# Patient Record
Sex: Male | Born: 1970 | Race: Black or African American | Hispanic: No | Marital: Single | Smoking: Former smoker
Health system: Southern US, Community
[De-identification: ages and names within clinical notes are randomized; demographics above are authoritative.]

## PROBLEM LIST (undated history)

## (undated) DIAGNOSIS — G4730 Sleep apnea, unspecified: Secondary | ICD-10-CM

## (undated) DIAGNOSIS — I10 Essential (primary) hypertension: Secondary | ICD-10-CM

## (undated) DIAGNOSIS — E119 Type 2 diabetes mellitus without complications: Secondary | ICD-10-CM

## (undated) DIAGNOSIS — E1122 Type 2 diabetes mellitus with diabetic chronic kidney disease: Secondary | ICD-10-CM

## (undated) DIAGNOSIS — T7840XA Allergy, unspecified, initial encounter: Secondary | ICD-10-CM

## (undated) DIAGNOSIS — I340 Nonrheumatic mitral (valve) insufficiency: Secondary | ICD-10-CM

## (undated) DIAGNOSIS — E1100 Type 2 diabetes mellitus with hyperosmolarity without nonketotic hyperglycemic-hyperosmolar coma (NKHHC): Secondary | ICD-10-CM

## (undated) DIAGNOSIS — E785 Hyperlipidemia, unspecified: Secondary | ICD-10-CM

## (undated) DIAGNOSIS — I5189 Other ill-defined heart diseases: Secondary | ICD-10-CM

## (undated) DIAGNOSIS — N189 Chronic kidney disease, unspecified: Secondary | ICD-10-CM

## (undated) DIAGNOSIS — Z6841 Body Mass Index (BMI) 40.0 and over, adult: Secondary | ICD-10-CM

## (undated) HISTORY — DX: Type 2 diabetes mellitus without complications: E11.9

## (undated) HISTORY — DX: Allergy, unspecified, initial encounter: T78.40XA

## (undated) HISTORY — DX: Hyperlipidemia, unspecified: E78.5

## (undated) HISTORY — DX: Chronic kidney disease, unspecified: N18.9

## (undated) HISTORY — PX: TOOTH EXTRACTION: SUR596

## (undated) HISTORY — PX: WISDOM TOOTH EXTRACTION: SHX21

## (undated) HISTORY — PX: OTHER SURGICAL HISTORY: SHX169

---

## 2017-01-31 DIAGNOSIS — Z23 Encounter for immunization: Secondary | ICD-10-CM | POA: Diagnosis not present

## 2017-04-12 DIAGNOSIS — Z6841 Body Mass Index (BMI) 40.0 and over, adult: Secondary | ICD-10-CM | POA: Diagnosis not present

## 2017-04-12 DIAGNOSIS — I10 Essential (primary) hypertension: Secondary | ICD-10-CM | POA: Diagnosis not present

## 2017-04-12 DIAGNOSIS — E782 Mixed hyperlipidemia: Secondary | ICD-10-CM | POA: Diagnosis not present

## 2017-04-12 DIAGNOSIS — E1165 Type 2 diabetes mellitus with hyperglycemia: Secondary | ICD-10-CM | POA: Diagnosis not present

## 2017-05-24 DIAGNOSIS — E782 Mixed hyperlipidemia: Secondary | ICD-10-CM | POA: Diagnosis not present

## 2017-05-24 DIAGNOSIS — I10 Essential (primary) hypertension: Secondary | ICD-10-CM | POA: Diagnosis not present

## 2017-05-24 DIAGNOSIS — E1129 Type 2 diabetes mellitus with other diabetic kidney complication: Secondary | ICD-10-CM | POA: Diagnosis not present

## 2017-05-24 DIAGNOSIS — Z6841 Body Mass Index (BMI) 40.0 and over, adult: Secondary | ICD-10-CM | POA: Diagnosis not present

## 2017-07-26 DIAGNOSIS — Z23 Encounter for immunization: Secondary | ICD-10-CM | POA: Diagnosis not present

## 2017-07-26 DIAGNOSIS — G4730 Sleep apnea, unspecified: Secondary | ICD-10-CM | POA: Diagnosis not present

## 2017-07-26 DIAGNOSIS — E6609 Other obesity due to excess calories: Secondary | ICD-10-CM | POA: Diagnosis not present

## 2017-07-26 DIAGNOSIS — E782 Mixed hyperlipidemia: Secondary | ICD-10-CM | POA: Diagnosis not present

## 2017-07-26 DIAGNOSIS — E1129 Type 2 diabetes mellitus with other diabetic kidney complication: Secondary | ICD-10-CM | POA: Diagnosis not present

## 2017-07-26 DIAGNOSIS — I10 Essential (primary) hypertension: Secondary | ICD-10-CM | POA: Diagnosis not present

## 2017-07-26 DIAGNOSIS — Z6841 Body Mass Index (BMI) 40.0 and over, adult: Secondary | ICD-10-CM | POA: Diagnosis not present

## 2018-01-28 DIAGNOSIS — Z0000 Encounter for general adult medical examination without abnormal findings: Secondary | ICD-10-CM | POA: Diagnosis not present

## 2018-01-28 DIAGNOSIS — E782 Mixed hyperlipidemia: Secondary | ICD-10-CM | POA: Diagnosis not present

## 2018-01-28 DIAGNOSIS — Z125 Encounter for screening for malignant neoplasm of prostate: Secondary | ICD-10-CM | POA: Diagnosis not present

## 2018-01-28 DIAGNOSIS — E1129 Type 2 diabetes mellitus with other diabetic kidney complication: Secondary | ICD-10-CM | POA: Diagnosis not present

## 2018-01-28 DIAGNOSIS — Z6841 Body Mass Index (BMI) 40.0 and over, adult: Secondary | ICD-10-CM | POA: Diagnosis not present

## 2018-01-28 DIAGNOSIS — I10 Essential (primary) hypertension: Secondary | ICD-10-CM | POA: Diagnosis not present

## 2018-01-28 DIAGNOSIS — G4730 Sleep apnea, unspecified: Secondary | ICD-10-CM | POA: Diagnosis not present

## 2018-02-15 DIAGNOSIS — Z23 Encounter for immunization: Secondary | ICD-10-CM | POA: Diagnosis not present

## 2018-04-04 DIAGNOSIS — G4730 Sleep apnea, unspecified: Secondary | ICD-10-CM | POA: Diagnosis not present

## 2018-04-04 DIAGNOSIS — E782 Mixed hyperlipidemia: Secondary | ICD-10-CM | POA: Diagnosis not present

## 2018-04-04 DIAGNOSIS — Z6841 Body Mass Index (BMI) 40.0 and over, adult: Secondary | ICD-10-CM | POA: Diagnosis not present

## 2018-04-04 DIAGNOSIS — E1129 Type 2 diabetes mellitus with other diabetic kidney complication: Secondary | ICD-10-CM | POA: Diagnosis not present

## 2018-04-04 DIAGNOSIS — I10 Essential (primary) hypertension: Secondary | ICD-10-CM | POA: Diagnosis not present

## 2018-12-31 DIAGNOSIS — I10 Essential (primary) hypertension: Secondary | ICD-10-CM | POA: Diagnosis present

## 2021-02-04 ENCOUNTER — Emergency Department: Payer: 59

## 2021-02-04 ENCOUNTER — Inpatient Hospital Stay
Admission: EM | Admit: 2021-02-04 | Discharge: 2021-02-07 | DRG: 638 | Disposition: A | Discharge status: Home / Self Care | Payer: 59 | Attending: Internal Medicine | Admitting: Internal Medicine

## 2021-02-04 ENCOUNTER — Other Ambulatory Visit: Payer: Self-pay

## 2021-02-04 ENCOUNTER — Inpatient Hospital Stay: Payer: 59

## 2021-02-04 ENCOUNTER — Encounter: Payer: Self-pay | Admitting: Emergency Medicine

## 2021-02-04 DIAGNOSIS — N1831 Chronic kidney disease, stage 3a: Secondary | ICD-10-CM

## 2021-02-04 DIAGNOSIS — E1122 Type 2 diabetes mellitus with diabetic chronic kidney disease: Secondary | ICD-10-CM | POA: Diagnosis not present

## 2021-02-04 DIAGNOSIS — Z79899 Other long term (current) drug therapy: Secondary | ICD-10-CM

## 2021-02-04 DIAGNOSIS — E1100 Type 2 diabetes mellitus with hyperosmolarity without nonketotic hyperglycemic-hyperosmolar coma (NKHHC): Secondary | ICD-10-CM | POA: Diagnosis not present

## 2021-02-04 DIAGNOSIS — N184 Chronic kidney disease, stage 4 (severe): Secondary | ICD-10-CM | POA: Diagnosis not present

## 2021-02-04 DIAGNOSIS — H539 Unspecified visual disturbance: Secondary | ICD-10-CM

## 2021-02-04 DIAGNOSIS — G4733 Obstructive sleep apnea (adult) (pediatric): Secondary | ICD-10-CM | POA: Diagnosis present

## 2021-02-04 DIAGNOSIS — I129 Hypertensive chronic kidney disease with stage 1 through stage 4 chronic kidney disease, or unspecified chronic kidney disease: Secondary | ICD-10-CM | POA: Diagnosis present

## 2021-02-04 DIAGNOSIS — Z20822 Contact with and (suspected) exposure to covid-19: Secondary | ICD-10-CM | POA: Diagnosis not present

## 2021-02-04 DIAGNOSIS — R778 Other specified abnormalities of plasma proteins: Secondary | ICD-10-CM | POA: Diagnosis present

## 2021-02-04 DIAGNOSIS — I248 Other forms of acute ischemic heart disease: Secondary | ICD-10-CM | POA: Diagnosis not present

## 2021-02-04 DIAGNOSIS — R079 Chest pain, unspecified: Secondary | ICD-10-CM | POA: Diagnosis not present

## 2021-02-04 DIAGNOSIS — E785 Hyperlipidemia, unspecified: Secondary | ICD-10-CM | POA: Diagnosis present

## 2021-02-04 DIAGNOSIS — N179 Acute kidney failure, unspecified: Secondary | ICD-10-CM | POA: Diagnosis not present

## 2021-02-04 DIAGNOSIS — K7689 Other specified diseases of liver: Secondary | ICD-10-CM | POA: Diagnosis not present

## 2021-02-04 DIAGNOSIS — G43109 Migraine with aura, not intractable, without status migrainosus: Secondary | ICD-10-CM | POA: Diagnosis present

## 2021-02-04 DIAGNOSIS — N1832 Chronic kidney disease, stage 3b: Secondary | ICD-10-CM | POA: Diagnosis not present

## 2021-02-04 DIAGNOSIS — I10 Essential (primary) hypertension: Secondary | ICD-10-CM | POA: Diagnosis present

## 2021-02-04 DIAGNOSIS — Z9114 Patient's other noncompliance with medication regimen: Secondary | ICD-10-CM

## 2021-02-04 DIAGNOSIS — R519 Headache, unspecified: Secondary | ICD-10-CM | POA: Diagnosis not present

## 2021-02-04 DIAGNOSIS — E1165 Type 2 diabetes mellitus with hyperglycemia: Secondary | ICD-10-CM | POA: Diagnosis present

## 2021-02-04 DIAGNOSIS — E1129 Type 2 diabetes mellitus with other diabetic kidney complication: Secondary | ICD-10-CM | POA: Diagnosis present

## 2021-02-04 DIAGNOSIS — E113599 Type 2 diabetes mellitus with proliferative diabetic retinopathy without macular edema, unspecified eye: Secondary | ICD-10-CM | POA: Diagnosis not present

## 2021-02-04 DIAGNOSIS — E669 Obesity, unspecified: Secondary | ICD-10-CM | POA: Diagnosis not present

## 2021-02-04 DIAGNOSIS — Z7984 Long term (current) use of oral hypoglycemic drugs: Secondary | ICD-10-CM | POA: Diagnosis not present

## 2021-02-04 DIAGNOSIS — N189 Chronic kidney disease, unspecified: Secondary | ICD-10-CM | POA: Diagnosis present

## 2021-02-04 DIAGNOSIS — H4010X0 Unspecified open-angle glaucoma, stage unspecified: Secondary | ICD-10-CM | POA: Diagnosis present

## 2021-02-04 DIAGNOSIS — E871 Hypo-osmolality and hyponatremia: Secondary | ICD-10-CM | POA: Diagnosis not present

## 2021-02-04 DIAGNOSIS — I6782 Cerebral ischemia: Secondary | ICD-10-CM | POA: Diagnosis not present

## 2021-02-04 DIAGNOSIS — I161 Hypertensive emergency: Secondary | ICD-10-CM | POA: Diagnosis not present

## 2021-02-04 DIAGNOSIS — R29818 Other symptoms and signs involving the nervous system: Secondary | ICD-10-CM | POA: Diagnosis not present

## 2021-02-04 DIAGNOSIS — R600 Localized edema: Secondary | ICD-10-CM | POA: Diagnosis not present

## 2021-02-04 DIAGNOSIS — K760 Fatty (change of) liver, not elsewhere classified: Secondary | ICD-10-CM | POA: Diagnosis present

## 2021-02-04 DIAGNOSIS — R7989 Other specified abnormal findings of blood chemistry: Secondary | ICD-10-CM | POA: Diagnosis present

## 2021-02-04 DIAGNOSIS — Z8249 Family history of ischemic heart disease and other diseases of the circulatory system: Secondary | ICD-10-CM | POA: Diagnosis not present

## 2021-02-04 DIAGNOSIS — R42 Dizziness and giddiness: Secondary | ICD-10-CM | POA: Diagnosis not present

## 2021-02-04 HISTORY — DX: Essential (primary) hypertension: I10

## 2021-02-04 HISTORY — DX: Type 2 diabetes mellitus without complications: E11.9

## 2021-02-04 LAB — COMPREHENSIVE METABOLIC PANEL
ALT: 20 U/L (ref 0–44)
AST: 26 U/L (ref 15–41)
Albumin: 3 g/dL — ABNORMAL LOW (ref 3.5–5.0)
Alkaline Phosphatase: 81 U/L (ref 38–126)
Anion gap: 12 (ref 5–15)
BUN: 28 mg/dL — ABNORMAL HIGH (ref 6–20)
CO2: 25 mmol/L (ref 22–32)
Calcium: 9.1 mg/dL (ref 8.9–10.3)
Chloride: 91 mmol/L — ABNORMAL LOW (ref 98–111)
Creatinine, Ser: 3.09 mg/dL — ABNORMAL HIGH (ref 0.61–1.24)
GFR, Estimated: 24 mL/min — ABNORMAL LOW (ref >60)
Glucose, Bld: 546 mg/dL (ref 70–99)
Potassium: 3.6 mmol/L (ref 3.5–5.1)
Sodium: 128 mmol/L — ABNORMAL LOW (ref 135–145)
Total Bilirubin: 0.8 mg/dL (ref 0.3–1.2)
Total Protein: 7.7 g/dL (ref 6.5–8.1)

## 2021-02-04 LAB — BASIC METABOLIC PANEL
Anion gap: 8 (ref 5–15)
Anion gap: 11 (ref 5–15)
Anion gap: 8 (ref 5–15)
BUN: 27 mg/dL — ABNORMAL HIGH (ref 6–20)
BUN: 25 mg/dL — ABNORMAL HIGH (ref 6–20)
BUN: 25 mg/dL — ABNORMAL HIGH (ref 6–20)
CO2: 27 mmol/L (ref 22–32)
CO2: 24 mmol/L (ref 22–32)
CO2: 27 mmol/L (ref 22–32)
Calcium: 8.8 mg/dL — ABNORMAL LOW (ref 8.9–10.3)
Calcium: 8.7 mg/dL — ABNORMAL LOW (ref 8.9–10.3)
Calcium: 8.7 mg/dL — ABNORMAL LOW (ref 8.9–10.3)
Chloride: 98 mmol/L (ref 98–111)
Chloride: 100 mmol/L (ref 98–111)
Chloride: 100 mmol/L (ref 98–111)
Creatinine, Ser: 2.76 mg/dL — ABNORMAL HIGH (ref 0.61–1.24)
Creatinine, Ser: 2.58 mg/dL — ABNORMAL HIGH (ref 0.61–1.24)
Creatinine, Ser: 2.52 mg/dL — ABNORMAL HIGH (ref 0.61–1.24)
GFR, Estimated: 28 mL/min — ABNORMAL LOW (ref >60)
GFR, Estimated: 30 mL/min — ABNORMAL LOW (ref >60)
GFR, Estimated: 31 mL/min — ABNORMAL LOW (ref >60)
Glucose, Bld: 386 mg/dL — ABNORMAL HIGH (ref 70–99)
Glucose, Bld: 255 mg/dL — ABNORMAL HIGH (ref 70–99)
Glucose, Bld: 223 mg/dL — ABNORMAL HIGH (ref 70–99)
Potassium: 3 mmol/L — ABNORMAL LOW (ref 3.5–5.1)
Potassium: 3.2 mmol/L — ABNORMAL LOW (ref 3.5–5.1)
Potassium: 3.1 mmol/L — ABNORMAL LOW (ref 3.5–5.1)
Sodium: 133 mmol/L — ABNORMAL LOW (ref 135–145)
Sodium: 135 mmol/L (ref 135–145)
Sodium: 135 mmol/L (ref 135–145)

## 2021-02-04 LAB — CBC WITH DIFFERENTIAL/PLATELET
Abs Immature Granulocytes: 0.08 10*3/uL — ABNORMAL HIGH (ref 0.00–0.07)
Basophils Absolute: 0 10*3/uL (ref 0.0–0.1)
Basophils Relative: 0 %
Eosinophils Absolute: 0.1 10*3/uL (ref 0.0–0.5)
Eosinophils Relative: 1 %
HCT: 35.9 % — ABNORMAL LOW (ref 39.0–52.0)
Hemoglobin: 12.8 g/dL — ABNORMAL LOW (ref 13.0–17.0)
Immature Granulocytes: 1 %
Lymphocytes Relative: 32 %
Lymphs Abs: 3 10*3/uL (ref 0.7–4.0)
MCH: 31 pg (ref 26.0–34.0)
MCHC: 35.7 g/dL (ref 30.0–36.0)
MCV: 86.9 fL (ref 80.0–100.0)
Monocytes Absolute: 0.6 10*3/uL (ref 0.1–1.0)
Monocytes Relative: 7 %
Neutro Abs: 5.7 10*3/uL (ref 1.7–7.7)
Neutrophils Relative %: 59 %
Platelets: 403 10*3/uL — ABNORMAL HIGH (ref 150–400)
RBC: 4.13 MIL/uL — ABNORMAL LOW (ref 4.22–5.81)
RDW: 12.7 % (ref 11.5–15.5)
WBC: 9.5 10*3/uL (ref 4.0–10.5)
nRBC: 0.2 % (ref 0.0–0.2)

## 2021-02-04 LAB — GLUCOSE, CAPILLARY
Glucose-Capillary: 404 mg/dL — ABNORMAL HIGH (ref 70–99)
Glucose-Capillary: 445 mg/dL — ABNORMAL HIGH (ref 70–99)
Glucose-Capillary: 306 mg/dL — ABNORMAL HIGH (ref 70–99)
Glucose-Capillary: 311 mg/dL — ABNORMAL HIGH (ref 70–99)
Glucose-Capillary: 238 mg/dL — ABNORMAL HIGH (ref 70–99)
Glucose-Capillary: 213 mg/dL — ABNORMAL HIGH (ref 70–99)
Glucose-Capillary: 204 mg/dL — ABNORMAL HIGH (ref 70–99)
Glucose-Capillary: 186 mg/dL — ABNORMAL HIGH (ref 70–99)

## 2021-02-04 LAB — URINE DRUG SCREEN, QUALITATIVE (ARMC ONLY)
Amphetamines, Ur Screen: NOT DETECTED
Barbiturates, Ur Screen: NOT DETECTED
Benzodiazepine, Ur Scrn: NOT DETECTED
Cannabinoid 50 Ng, Ur ~~LOC~~: NOT DETECTED
Cocaine Metabolite,Ur ~~LOC~~: NOT DETECTED
MDMA (Ecstasy)Ur Screen: NOT DETECTED
Methadone Scn, Ur: NOT DETECTED
Opiate, Ur Screen: NOT DETECTED
Phencyclidine (PCP) Ur S: NOT DETECTED
Tricyclic, Ur Screen: NOT DETECTED

## 2021-02-04 LAB — RESP PANEL BY RT-PCR (FLU A&B, COVID) ARPGX2
Influenza A by PCR: NEGATIVE
Influenza B by PCR: NEGATIVE
SARS Coronavirus 2 by RT PCR: NEGATIVE

## 2021-02-04 LAB — TROPONIN I (HIGH SENSITIVITY)
Troponin I (High Sensitivity): 26 ng/L — ABNORMAL HIGH (ref <18)
Troponin I (High Sensitivity): 28 ng/L — ABNORMAL HIGH (ref <18)
Troponin I (High Sensitivity): 27 ng/L — ABNORMAL HIGH (ref <18)
Troponin I (High Sensitivity): 28 ng/L — ABNORMAL HIGH (ref <18)

## 2021-02-04 LAB — CBG MONITORING, ED: Glucose-Capillary: 473 mg/dL — ABNORMAL HIGH (ref 70–99)

## 2021-02-04 LAB — MRSA NEXT GEN BY PCR, NASAL: MRSA by PCR Next Gen: NOT DETECTED

## 2021-02-04 LAB — OSMOLALITY: Osmolality: 311 mOsm/kg — ABNORMAL HIGH (ref 275–295)

## 2021-02-04 MED ORDER — POTASSIUM CHLORIDE 10 MEQ/100ML IV SOLN
10.0000 meq | INTRAVENOUS | Status: AC
  Administered 2021-02-04 (×2): 10 meq via INTRAVENOUS
  Filled 2021-02-04 (×2): qty 100

## 2021-02-04 MED ORDER — INSULIN GLARGINE-YFGN 100 UNIT/ML ~~LOC~~ SOLN
8.0000 [IU] | Freq: Once | SUBCUTANEOUS | Status: AC
  Administered 2021-02-04: 8 [IU] via SUBCUTANEOUS
  Filled 2021-02-04: qty 0.08

## 2021-02-04 MED ORDER — INSULIN ASPART 100 UNIT/ML IJ SOLN
0.0000 [IU] | Freq: Three times a day (TID) | INTRAMUSCULAR | Status: DC
  Administered 2021-02-05: 4 [IU] via SUBCUTANEOUS
  Administered 2021-02-05: 20 [IU] via SUBCUTANEOUS
  Administered 2021-02-05: 11 [IU] via SUBCUTANEOUS
  Administered 2021-02-06: 7 [IU] via SUBCUTANEOUS
  Administered 2021-02-06 (×2): 11 [IU] via SUBCUTANEOUS
  Administered 2021-02-07: 4 [IU] via SUBCUTANEOUS
  Administered 2021-02-07: 7 [IU] via SUBCUTANEOUS
  Filled 2021-02-04 (×8): qty 1

## 2021-02-04 MED ORDER — HYDRALAZINE HCL 20 MG/ML IJ SOLN
5.0000 mg | INTRAMUSCULAR | Status: DC | PRN
  Administered 2021-02-04 – 2021-02-06 (×7): 5 mg via INTRAVENOUS
  Filled 2021-02-04: qty 1
  Filled 2021-02-04: qty 0.25
  Filled 2021-02-04 (×6): qty 1

## 2021-02-04 MED ORDER — LACTATED RINGERS IV SOLN: INTRAVENOUS | Status: DC

## 2021-02-04 MED ORDER — ENOXAPARIN SODIUM 60 MG/0.6ML IJ SOSY: 0.5000 mg/kg | PREFILLED_SYRINGE | INTRAMUSCULAR | Status: DC

## 2021-02-04 MED ORDER — ENOXAPARIN SODIUM 40 MG/0.4ML IJ SOSY: 40.0000 mg | PREFILLED_SYRINGE | INTRAMUSCULAR | Status: DC

## 2021-02-04 MED ORDER — HYDRALAZINE HCL 20 MG/ML IJ SOLN
5.0000 mg | INTRAMUSCULAR | Status: DC | PRN
  Administered 2021-02-04: 5 mg via INTRAVENOUS
  Filled 2021-02-04: qty 1

## 2021-02-04 MED ORDER — POTASSIUM CHLORIDE CRYS ER 20 MEQ PO TBCR
40.0000 meq | EXTENDED_RELEASE_TABLET | Freq: Once | ORAL | Status: AC
  Administered 2021-02-04: 40 meq via ORAL
  Filled 2021-02-04: qty 2

## 2021-02-04 MED ORDER — DEXTROSE 50 % IV SOLN: 0.0000 mL | INTRAVENOUS | Status: DC | PRN

## 2021-02-04 MED ORDER — INSULIN ASPART 100 UNIT/ML IJ SOLN
0.0000 [IU] | Freq: Every day | INTRAMUSCULAR | Status: DC
  Administered 2021-02-05: 2 [IU] via SUBCUTANEOUS
  Administered 2021-02-06: 3 [IU] via SUBCUTANEOUS
  Filled 2021-02-04 (×2): qty 1

## 2021-02-04 MED ORDER — INSULIN REGULAR(HUMAN) IN NACL 100-0.9 UT/100ML-% IV SOLN
INTRAVENOUS | Status: DC
  Administered 2021-02-04: 11.5 [IU]/h via INTRAVENOUS
  Filled 2021-02-04 (×2): qty 100

## 2021-02-04 MED ORDER — DEXTROSE IN LACTATED RINGERS 5 % IV SOLN: INTRAVENOUS | Status: DC

## 2021-02-04 MED ORDER — DIPHENHYDRAMINE HCL 25 MG PO CAPS
25.0000 mg | ORAL_CAPSULE | Freq: Four times a day (QID) | ORAL | Status: DC | PRN
  Filled 2021-02-04: qty 1

## 2021-02-04 MED ORDER — ASPIRIN EC 81 MG PO TBEC
81.0000 mg | DELAYED_RELEASE_TABLET | Freq: Every day | ORAL | Status: DC
  Administered 2021-02-04 – 2021-02-07 (×4): 81 mg via ORAL
  Filled 2021-02-04 (×4): qty 1

## 2021-02-04 MED ORDER — AMLODIPINE BESYLATE 10 MG PO TABS
10.0000 mg | ORAL_TABLET | Freq: Every day | ORAL | Status: DC
  Administered 2021-02-04 – 2021-02-07 (×4): 10 mg via ORAL
  Filled 2021-02-04 (×2): qty 1
  Filled 2021-02-04: qty 2
  Filled 2021-02-04: qty 1

## 2021-02-04 MED ORDER — LABETALOL HCL 5 MG/ML IV SOLN
10.0000 mg | Freq: Once | INTRAVENOUS | Status: AC
  Administered 2021-02-04: 10 mg via INTRAVENOUS
  Filled 2021-02-04: qty 4

## 2021-02-04 MED ORDER — HYDRALAZINE HCL 20 MG/ML IJ SOLN: 10.0000 mg | INTRAMUSCULAR | Status: DC | PRN

## 2021-02-04 MED ORDER — CHLORHEXIDINE GLUCONATE CLOTH 2 % EX PADS: 6.0000 | MEDICATED_PAD | Freq: Every day | CUTANEOUS | Status: DC

## 2021-02-04 MED ORDER — AMLODIPINE BESYLATE 5 MG PO TABS: 10.0000 mg | ORAL_TABLET | Freq: Every day | ORAL | Status: DC

## 2021-02-04 MED ORDER — ACETAMINOPHEN 325 MG PO TABS: 650.0000 mg | ORAL_TABLET | Freq: Four times a day (QID) | ORAL | Status: DC | PRN

## 2021-02-04 MED ORDER — INSULIN ASPART 100 UNIT/ML IJ SOLN: 10.0000 [IU] | Freq: Once | INTRAMUSCULAR | Status: DC

## 2021-02-04 MED ORDER — SODIUM CHLORIDE 0.9 % IV BOLUS
1000.0000 mL | Freq: Once | INTRAVENOUS | Status: AC
  Administered 2021-02-04: 1000 mL via INTRAVENOUS

## 2021-02-04 MED ORDER — HYDROCHLOROTHIAZIDE 25 MG PO TABS
25.0000 mg | ORAL_TABLET | Freq: Every day | ORAL | Status: DC
  Administered 2021-02-05 – 2021-02-06 (×2): 25 mg via ORAL
  Filled 2021-02-04 (×2): qty 1

## 2021-02-04 MED ORDER — NITROGLYCERIN 0.4 MG SL SUBL: 0.4000 mg | SUBLINGUAL_TABLET | SUBLINGUAL | Status: DC | PRN

## 2021-02-04 NOTE — ED Provider Notes (Signed)
[facility]  ____________________________________________   Event Date/Time   First MD Initiated Contact with Patient 02/04/21 1215     (approximate)  I have reviewed the triage vital signs and the nursing notes.   HISTORY  Chief Complaint Hypertension    HPI David Klein is a 50 y.o. male with past medical history of diabetes and hypertension who presents with intermittent headaches and elevated blood pressure.  Patient notes that he has been out of his antihypertensives for several months.  Was previously on HCTZ and amlodipine but could not get into see a physician and has not had refills.  He notes that over the last week he has felt intermittently lightheaded with some frontal headache that is intermittent.  Denies associated nausea vomiting.  1 day last week he did briefly feel like the right leg was numb and weak this lasted for several minutes was not associated with any arm weakness or difficulty speaking and this has now resolved.  He also notes that he has had intermittent what he describes as hallucinations in the left eye.  Says it is like a light with prisms.  He is not having this at this time.  Denies any blurry vision double vision.  He denies chest pain difficulty breathing or abdominal pain.         Past Medical History:  Diagnosis Date   Diabetes ([place])    Hypertension     Patient Active Problem List   Diagnosis Date Noted   Hypertensive emergency 02/04/2021   Hypertension    Acute renal failure superimposed on stage 3a chronic kidney disease (HCC)    Type II diabetes mellitus with renal manifestations (HCC)    Hyponatremia    Type 2 diabetes mellitus with hyperosmolar hyperglycemic state (HHS) (HCC)    Elevated troponin    Change in vision     Past Surgical History:  Procedure Laterality Date   liver biopgy N/A     Prior to Admission medications   Not on File    Allergies Patient has no known allergies.  Family  History  Problem Relation Age of Onset   Hypertension Mother    Hypertension Father     Social History Social History   Tobacco Use   Smoking status: Never   Smokeless tobacco: Never  Substance Use Topics   Alcohol use: Not Currently    Comment: occasionally   Drug use: Never    Review of Systems   Review of Systems  Constitutional:  Negative for chills and fever.  Eyes:  Positive for visual disturbance.  Respiratory:  Negative for shortness of breath.   Cardiovascular:  Negative for chest pain and leg swelling.  Gastrointestinal:  Negative for abdominal pain, nausea and vomiting.  Neurological:  Positive for headaches. Negative for weakness and numbness.  All other systems reviewed and are negative.  Physical Exam Updated Vital Signs BP (!) 150/100   Pulse 80   Temp 98.9 F (37.2 C) (Oral)   Resp 17   Ht '5\' 3"'$  (1.6 m)   Wt 99.8 kg   SpO2 97%   BMI 38.97 kg/m   Physical Exam Vitals and nursing note reviewed.  Constitutional:      General: He is not in acute distress.    Appearance: Normal appearance.  HENT:     Head: Normocephalic and atraumatic.  Eyes:     General: No scleral icterus.    Conjunctiva/sclera: Conjunctivae normal.  Pulmonary:     Effort:  Pulmonary effort is normal. No respiratory distress.     Breath sounds: Normal breath sounds. No wheezing.  Musculoskeletal:        General: No deformity or signs of injury.     Cervical back: Normal range of motion.  Skin:    Coloration: Skin is not jaundiced or pale.  Neurological:     General: No focal deficit present.     Mental Status: He is alert and oriented to person, place, and time. Mental status is at baseline.     Comments: Aox3, nml speech  PERRL, EOMI, face symmetric, nml tongue movement  5/5 strength in the BL upper and lower extremities  Sensation grossly intact in the BL upper and lower extremities  Finger-nose-finger intact BL   Psychiatric:        Mood and Affect: Mood normal.         Behavior: Behavior normal.     LABS (all labs ordered are listed, but only abnormal results are displayed)  Labs Reviewed  COMPREHENSIVE METABOLIC PANEL - Abnormal; Notable for the following components:      Result Value   Sodium 128 (*)    Chloride 91 (*)    Glucose, Bld 546 (*)    BUN 28 (*)    Creatinine, Ser 3.09 (*)    Albumin 3.0 (*)    GFR, Estimated 24 (*)    All other components within normal limits  CBC WITH DIFFERENTIAL/PLATELET - Abnormal; Notable for the following components:   RBC 4.13 (*)    Hemoglobin 12.8 (*)    HCT 35.9 (*)    Platelets 403 (*)    Abs Immature Granulocytes 0.08 (*)    All other components within normal limits  TROPONIN I (HIGH SENSITIVITY) - Abnormal; Notable for the following components:   Troponin I (High Sensitivity) 26 (*)    All other components within normal limits  RESP PANEL BY RT-PCR (FLU A&B, COVID) ARPGX2  HEMOGLOBIN A1C  URINE DRUG SCREEN, QUALITATIVE ([facility] ONLY)  OSMOLALITY  HIV ANTIBODY (ROUTINE TESTING W REFLEX)  BASIC METABOLIC PANEL  BASIC METABOLIC PANEL  BASIC METABOLIC PANEL  BASIC METABOLIC PANEL  TROPONIN I (HIGH SENSITIVITY)   ____________________________________________  EKG  Right axis deviation, sinus tachycardia no acute ischemic changes ____________________________________________  RADIOLOGY Almeta Monas, personally viewed and evaluated these images (plain radiographs) as part of my medical decision making, as well as reviewing the written report by the radiologist.  ED MD interpretation:  I reviewed the CXR which does not show any acute cardiopulmonary process   I reviewed the CT scan of the brain which does not show any acute intracranial process      ____________________________________________   PROCEDURES  Procedure(s) performed (including Critical Care):  Procedures   ____________________________________________   INITIAL IMPRESSION / ASSESSMENT AND PLAN / ED  COURSE     Patient is a 50 year old male with history of hypertension not on meds who presents with some vague symptoms of headache and visual disturbance.  Vital signs notable for significant hypertension 240s over 120s and tachycardia to the 1 teens.  Satting fine on room air.  Overall on exam he is very well-appearing.  His neurologic exam is normal.  He does have brief right extremity numbness weakness that lasted for minutes and then resolved as well as some intermittent what sounds like visual aura in the left eye.  Has also had intermittent headaches.  He denies any pain at this time.  His neurologic exam is  within normal limits.  EKG is nonischemic.  Will check CT head given his significant elevated blood pressure and basic labs.  His labs are notable for an elevated creatinine to 3, the last that I can see was from 2020 is 1.5.  His troponin is mildly elevated as well to 26, he has no active chest pain I suspect this is demand related.  Glucose also significant elevated to the 540s, he has no anion gap, bicarb is normal, no evidence of DKA.  Given his elevated troponin and elevated creatinine I did give him a dose of labetalol to lower his blood pressure.  Do not feel like he needs a drip at this time as this is still really not consistent with a hypertensive emergency.  Will need admission given the significant abnormalities.  I suspect that a lot of this is chronic.  Spoke with hospitalist who admit the patient.      ____________________________________________   FINAL CLINICAL IMPRESSION(S) / ED DIAGNOSES  Final diagnoses:  AKI (acute kidney injury) [facility])     ED Discharge Orders     None        Note:  This document was prepared using Dragon voice recognition software and may include unintentional dictation errors.    Rada Hay, MD 02/04/21 1536

## 2021-02-04 NOTE — H&P (Addendum)
History and Physical    David Klein [id number] DOB: 1970-11-10 DOA: 02/04/2021  Referring MD/NP/PA:   PCP: Pcp, No   Patient coming from:  The patient is coming from home.  At baseline, pt is independent for most of ADL.         Chief Complaint: headache, vision change, right leg numbness  HPI: David Klein is a 50 y.o. male with medical history significant of hypertension, diabetes mellitus, CKD stage IIIa, who presents with headache.  Patient states he ran out of his blood pressure medication for several months.  He used to take HCTZ and amlodipine.  He developed headache and dizziness in the past several days.  He also reports intermittent left eye vision change, described as seeing prism.  No vision loss or hearing loss. Patient also reports 1 episode of right leg numbness and tingling few days ago, which lasted for 15 to 20 minutes, resolved spontaneously.  Currently patient does not have unilateral numbness or tingling to extremities.  No facial droop or slurred speech.  Patient denies chest pain, cough, shortness breath.  No fever or chills.  No nausea, vomiting, diarrhea or abdominal pain.  No symptoms of UTI.    Patient was found to have elevated blood pressure 263/143, which improved to 183/112 after giving 10 mg of labetalol by IV in the ED.  ED Course: pt was found to have WBC 9.5, negative COVID PCR, troponin level 26, sodium 128 which is due to hyponatremia, HHS with blood sugar 546, bicarbonate of 25 and anion gap 12, worsening renal function, temperature normal, tachycardia with heart rate of 117, RR 20, oxygen saturation 98% on room air.  Chest x-ray negative.  CT of head is negative.  Review of Systems:   General: no fevers, chills, no body weight gain, has fatigue HEENT: no hearing changes or sore throat. Has vision change. Respiratory: no dyspnea, coughing, wheezing CV: no chest pain, no palpitations GI: no nausea, vomiting, abdominal pain, diarrhea,  constipation GU: no dysuria, burning on urination, increased urinary frequency, hematuria  Ext: no leg edema Neuro: Has headache, dizziness, right leg numbness, left eye vision change Skin: no rash, no skin tear. MSK: No muscle spasm, no deformity, no limitation of range of movement in spin Heme: No easy bruising.  Travel history: No recent long distant travel.  Allergy: No Known Allergies  Past Medical History:  Diagnosis Date   Diabetes ([place])    Hypertension     Past Surgical History:  Procedure Laterality Date   liver biopgy N/A     Social History:  reports that he has never smoked. He has never used smokeless tobacco. He reports that he does not currently use alcohol. He reports that he does not use drugs.  Family History:  Family History  Problem Relation Age of Onset   Hypertension Mother    Hypertension Father      Prior to Admission medications   Not on File    Physical Exam: Vitals:   02/04/21 1208 02/04/21 1330 02/04/21 1352 02/04/21 1400  BP: (!) 240/120 (!) 220/145 (!) 183/112 (!) 174/111  Pulse:  99 84 80  Resp:   17   Temp:      TempSrc:      SpO2:  98% 98% 97%  Weight:      Height:       General: Not in acute distress HEENT:       Eyes: PERRL, EOMI, no scleral icterus.  ENT: No discharge from the ears and nose, no pharynx injection, no tonsillar enlargement.        Neck: No JVD, no bruit, no mass felt. Heme: No neck lymph node enlargement. Cardiac: S1/S2, RRR, No murmurs, No gallops or rubs. Respiratory: No rales, wheezing, rhonchi or rubs. GI: Soft, nondistended, nontender, no rebound pain, no organomegaly, BS present. GU: No hematuria Ext: No pitting leg edema bilaterally. 1+DP/PT pulse bilaterally. Musculoskeletal: No joint deformities, No joint redness or warmth, no limitation of ROM in spin. Skin: No rashes.  Neuro: Alert, oriented X3, cranial nerves II-XII grossly intact, moves all extremities normally.  Psych: Patient is not  psychotic, no suicidal or hemocidal ideation.  Labs on Admission: I have personally reviewed following labs and imaging studies  CBC: Recent Labs  Lab 02/04/21 1223  WBC 9.5  NEUTROABS 5.7  HGB 12.8*  HCT 35.9*  MCV 86.9  PLT [id number]*   Basic Metabolic Panel: Recent Labs  Lab 02/04/21 1223  NA 128*  K 3.6  CL 91*  CO2 25  GLUCOSE 546*  BUN 28*  CREATININE 3.09*  CALCIUM 9.1   GFR: Estimated Creatinine Clearance: 31 mL/min (A) (by C-G formula based on SCr of 3.09 mg/dL (H)). Liver Function Tests: Recent Labs  Lab 02/04/21 1223  AST 26  ALT 20  ALKPHOS 81  BILITOT 0.8  PROT 7.7  ALBUMIN 3.0*   No results for input(s): LIPASE, AMYLASE in the last 168 hours. No results for input(s): AMMONIA in the last 168 hours. Coagulation Profile: No results for input(s): INR, PROTIME in the last 168 hours. Cardiac Enzymes: No results for input(s): CKTOTAL, CKMB, CKMBINDEX, TROPONINI in the last 168 hours. BNP (last 3 results) No results for input(s): PROBNP in the last 8760 hours. HbA1C: No results for input(s): HGBA1C in the last 72 hours. CBG: No results for input(s): GLUCAP in the last 168 hours. Lipid Profile: No results for input(s): CHOL, HDL, LDLCALC, TRIG, CHOLHDL, LDLDIRECT in the last 72 hours. Thyroid Function Tests: No results for input(s): TSH, T4TOTAL, FREET4, T3FREE, THYROIDAB in the last 72 hours. Anemia Panel: No results for input(s): VITAMINB12, FOLATE, FERRITIN, TIBC, IRON, RETICCTPCT in the last 72 hours. Urine analysis: No results found for: COLORURINE, APPEARANCEUR, LABSPEC, PHURINE, GLUCOSEU, HGBUR, BILIRUBINUR, KETONESUR, PROTEINUR, UROBILINOGEN, NITRITE, LEUKOCYTESUR Sepsis Labs: '@LABRCNTIP'$ (procalcitonin:4,lacticidven:4) ) Recent Results (from the past 240 hour(s))  Resp Panel by RT-PCR (Flu A&B, Covid) Nasopharyngeal Swab     Status: None   Collection Time: 02/04/21  1:32 PM   Specimen: Nasopharyngeal Swab; Nasopharyngeal(NP) swabs in vial  transport medium  Result Value Ref Range Status   SARS Coronavirus 2 by RT PCR NEGATIVE NEGATIVE Final    Comment: (NOTE) SARS-CoV-2 target nucleic acids are NOT DETECTED.  The SARS-CoV-2 RNA is generally detectable in upper respiratory specimens during the acute phase of infection. The lowest concentration of SARS-CoV-2 viral copies this assay can detect is 138 copies/mL. A negative result does not preclude SARS-Cov-2 infection and should not be used as the sole basis for treatment or other patient management decisions. A negative result may occur with  improper specimen collection/handling, submission of specimen other than nasopharyngeal swab, presence of viral mutation(s) within the areas targeted by this assay, and inadequate number of viral copies(<138 copies/mL). A negative result must be combined with clinical observations, patient history, and epidemiological information. The expected result is Negative.  Fact Sheet for Patients:  EntrepreneurPulse.com.au  Fact Sheet for Healthcare Providers:  IncredibleEmployment.be  This test is no t  yet approved or cleared by the [place] and  has been authorized for detection and/or diagnosis of SARS-CoV-2 by FDA under an Emergency Use Authorization (EUA). This EUA will remain  in effect (meaning this test can be used) for the duration of the COVID-19 declaration under Section 564(b)(1) of the Act, 21 U.S.C.section 360bbb-3(b)(1), unless the authorization is terminated  or revoked sooner.       Influenza A by PCR NEGATIVE NEGATIVE Final   Influenza B by PCR NEGATIVE NEGATIVE Final    Comment: (NOTE) The Xpert Xpress SARS-CoV-2/FLU/RSV plus assay is intended as an aid in the diagnosis of influenza from Nasopharyngeal swab specimens and should not be used as a sole basis for treatment. Nasal washings and aspirates are unacceptable for Xpert Xpress SARS-CoV-2/FLU/RSV testing.  Fact  Sheet for Patients: EntrepreneurPulse.com.au  Fact Sheet for Healthcare Providers: IncredibleEmployment.be  This test is not yet approved or cleared by the [place] FDA and has been authorized for detection and/or diagnosis of SARS-CoV-2 by FDA under an Emergency Use Authorization (EUA). This EUA will remain in effect (meaning this test can be used) for the duration of the COVID-19 declaration under Section 564(b)(1) of the Act, 21 U.S.C. section 360bbb-3(b)(1), unless the authorization is terminated or revoked.  Performed at [facility], [address], [place], [place] [address]      Radiological Exams on Admission: CT HEAD WO CONTRAST (5MM)  Result Date: 02/04/2021 CLINICAL DATA:  Hypertension, headache and dizziness for couple days EXAM: CT HEAD WITHOUT CONTRAST TECHNIQUE: Contiguous axial images were obtained from the base of the skull through the vertex without intravenous contrast. Sagittal and coronal MPR images reconstructed from axial data set. COMPARISON:  None FINDINGS: Brain: Normal ventricular morphology. No midline shift or mass effect. Normal appearance of brain parenchyma. No intracranial hemorrhage, mass lesion, or evidence of acute infarction. No extra-axial fluid collections. Vascular: No hyperdense vessels. Mild atherosclerotic calcification of internal carotid arteries at skull base Skull: Intact Sinuses/Orbits: Clear Other: N/A IMPRESSION: No acute intracranial abnormalities. Electronically Signed   By: Lavonia Dana M.D.   On: 02/04/2021 13:27   DG Chest Portable 1 View  Result Date: 02/04/2021 CLINICAL DATA:  Chest pain EXAM: PORTABLE CHEST 1 VIEW COMPARISON:  None. FINDINGS: The mediastinal contour is normal. The heart size is mildly enlarged. Both lungs are clear. The visualized skeletal structures are unremarkable. IMPRESSION: No active disease. Electronically Signed   By: Abelardo Diesel M.D.   On: 02/04/2021 13:43      EKG: I have personally reviewed.  Sinus rhythm, QTC 505, LAE, poor R wave progression, Q wave in lead III  Assessment/Plan Principal Problem:   Type 2 diabetes mellitus with hyperosmolar hyperglycemic state (HHS) ([place]) Active Problems:   Hypertensive emergency   Hypertension   Acute renal failure superimposed on stage 3a chronic kidney disease ([place])   Type II diabetes mellitus with renal manifestations (HCC)   Elevated troponin   Change in vision   Type 2 diabetes mellitus with hyperosmolar hyperglycemic state (HHS) ([place]): Blood sugar 546, anion gap 12, bicarbonate of 25, no DKA.  - Admit to stepdown  - IVF:  1L of NS bolus - start insulin gtt  - IVF: LR at 125 cc/h, will switch to D5-LR at 125 cc/h when CBG<250 - replete K as needed - Benadryl prn nausea  - NPO  - blood culture x 2 - consult to diabetic educator  Type II diabetes mellitus with renal manifestations: Recent A1c 7.3, poorly  controlled.  Patient states that he is taking metformin. -On insulin drip currently  Hypertensive emergency and hypertension: Blood pressure 263/143 initially, this is due to medication noncompliance. -Patient received 10 mg of labetalol in ED -Start amlodipine 10 mg daily -start HCTZ tomorrow since pt need IVF today. -IV hydralazine as needed, targeting systolic blood pressure 0000000 today  Elevated troponin: Troponin level 26.  Denies chest pain, most likely due to demand ischemia secondary to hypertensive emergency. -Aspirin 81 mg daily -As needed nitroglycerin if he develops chest pain -Trend troponin -Check A1c, FLP, UDS -Blood pressure control as above  Acute renal failure superimposed on stage 3a chronic kidney disease ([place]): Baseline creatinine 1.54 on 01/08/2019.  His creatinine is 3.09, BUN 28.  No symptoms of UTI, most likely due to uncontrolled diabetes and blood pressure. -Avoid using renal toxic medications -IV fluid as above -Follow-up renal ultrasound  Change in  vision: Patient reports intermittent left vision change, described as a seeing prism.  He also had 1 episode of right leg numbness.  CT head is negative for acute intracranial abnormalities.  We will get MRI to rule out a stroke. -Follow-up MRI of brain -Consulted ophthalmology, Dr. Peter Garter    DVT ppx:  SCD (pending evaluation of his left eye) Code Status: Full code Family Communication:   Yes, patient's mother Disposition Plan:  Anticipate discharge back to previous environment Consults called:  none Admission status and  Level of care: Stepdown:    SDU/inpation         Status is: Inpatient  Remains inpatient appropriate because:Inpatient level of care appropriate due to severity of illness  Dispo: The patient is from: Home              Anticipated d/c is to: Home              Patient currently is not medically stable to d/c.   Difficult to place patient No           Date of Service 02/04/2021    Ivor Costa [facility]   If 7PM-7AM, please contact night-coverage [email or web address] 02/04/2021, 3:09 PM

## 2021-02-04 NOTE — Plan of Care (Signed)
Patient arrived from ED via wheelchair.  Ambulatory in room with no issues.  Blood sugar is 445.  Starting insulin gtt.

## 2021-02-04 NOTE — ED Triage Notes (Signed)
Pt via POV from home. Pt c/o HTN, pt has a hx but has been out of his medication for about a month. Pt is c/o headache and dizziness for the past couple of days. Pt is A&Ox4 and NAD.

## 2021-02-04 NOTE — ED Notes (Signed)
Informed RN bed assigned 

## 2021-02-04 NOTE — Progress Notes (Signed)
PHARMACIST - PHYSICIAN COMMUNICATION  CONCERNING:  Enoxaparin (Lovenox) for DVT Prophylaxis    RECOMMENDATION: Patient was prescribed enoxaprin '40mg'$  q24 hours for VTE prophylaxis.   Filed Weights   02/04/21 1204  Weight: 99.8 kg (220 lb)    Body mass index is 38.97 kg/m.  Estimated Creatinine Clearance: 31 mL/min (A) (by C-G formula based on SCr of 3.09 mg/dL (H)).   Based on [place] patient is candidate for enoxaparin 0.'5mg'$ /kg TBW SQ every 24 hours based on BMI being >30.  DESCRIPTION: Pharmacy has adjusted enoxaparin dose per [facility] policy.  Patient is now receiving enoxaparin '50mg'$  mg every 24 hours   Henny Strauch Rodriguez-Guzman PharmD, BCPS 02/04/2021 2:25 PM

## 2021-02-05 LAB — BASIC METABOLIC PANEL
Anion gap: 6 (ref 5–15)
Anion gap: 7 (ref 5–15)
Anion gap: 7 (ref 5–15)
Anion gap: 8 (ref 5–15)
BUN: 23 mg/dL — ABNORMAL HIGH (ref 6–20)
BUN: 24 mg/dL — ABNORMAL HIGH (ref 6–20)
BUN: 24 mg/dL — ABNORMAL HIGH (ref 6–20)
BUN: 25 mg/dL — ABNORMAL HIGH (ref 6–20)
CO2: 25 mmol/L (ref 22–32)
CO2: 26 mmol/L (ref 22–32)
CO2: 29 mmol/L (ref 22–32)
CO2: 29 mmol/L (ref 22–32)
Calcium: 8.4 mg/dL — ABNORMAL LOW (ref 8.9–10.3)
Calcium: 8.5 mg/dL — ABNORMAL LOW (ref 8.9–10.3)
Calcium: 8.5 mg/dL — ABNORMAL LOW (ref 8.9–10.3)
Calcium: 8.7 mg/dL — ABNORMAL LOW (ref 8.9–10.3)
Chloride: 101 mmol/L (ref 98–111)
Chloride: 101 mmol/L (ref 98–111)
Chloride: 103 mmol/L (ref 98–111)
Chloride: 103 mmol/L (ref 98–111)
Creatinine, Ser: 2.41 mg/dL — ABNORMAL HIGH (ref 0.61–1.24)
Creatinine, Ser: 2.54 mg/dL — ABNORMAL HIGH (ref 0.61–1.24)
Creatinine, Ser: 2.57 mg/dL — ABNORMAL HIGH (ref 0.61–1.24)
Creatinine, Ser: 2.59 mg/dL — ABNORMAL HIGH (ref 0.61–1.24)
GFR, Estimated: 30 mL/min — ABNORMAL LOW (ref >60)
GFR, Estimated: 30 mL/min — ABNORMAL LOW (ref >60)
GFR, Estimated: 31 mL/min — ABNORMAL LOW (ref >60)
GFR, Estimated: 33 mL/min — ABNORMAL LOW (ref >60)
Glucose, Bld: 170 mg/dL — ABNORMAL HIGH (ref 70–99)
Glucose, Bld: 192 mg/dL — ABNORMAL HIGH (ref 70–99)
Glucose, Bld: 219 mg/dL — ABNORMAL HIGH (ref 70–99)
Glucose, Bld: 447 mg/dL — ABNORMAL HIGH (ref 70–99)
Potassium: 3.2 mmol/L — ABNORMAL LOW (ref 3.5–5.1)
Potassium: 3.3 mmol/L — ABNORMAL LOW (ref 3.5–5.1)
Potassium: 3.9 mmol/L (ref 3.5–5.1)
Potassium: 4.3 mmol/L (ref 3.5–5.1)
Sodium: 133 mmol/L — ABNORMAL LOW (ref 135–145)
Sodium: 135 mmol/L (ref 135–145)
Sodium: 138 mmol/L (ref 135–145)
Sodium: 139 mmol/L (ref 135–145)

## 2021-02-05 LAB — GLUCOSE, CAPILLARY
Glucose-Capillary: 153 mg/dL — ABNORMAL HIGH (ref 70–99)
Glucose-Capillary: 178 mg/dL — ABNORMAL HIGH (ref 70–99)
Glucose-Capillary: 184 mg/dL — ABNORMAL HIGH (ref 70–99)
Glucose-Capillary: 188 mg/dL — ABNORMAL HIGH (ref 70–99)
Glucose-Capillary: 218 mg/dL — ABNORMAL HIGH (ref 70–99)
Glucose-Capillary: 260 mg/dL — ABNORMAL HIGH (ref 70–99)
Glucose-Capillary: 406 mg/dL — ABNORMAL HIGH (ref 70–99)
Glucose-Capillary: 429 mg/dL — ABNORMAL HIGH (ref 70–99)
Glucose-Capillary: 440 mg/dL — ABNORMAL HIGH (ref 70–99)

## 2021-02-05 LAB — LIPID PANEL
Cholesterol: 281 mg/dL — ABNORMAL HIGH (ref 0–200)
HDL: 47 mg/dL (ref >40)
LDL Cholesterol: UNDETERMINED mg/dL (ref 0–99)
Total CHOL/HDL Ratio: 6 RATIO
Triglycerides: 442 mg/dL — ABNORMAL HIGH (ref <150)
VLDL: UNDETERMINED mg/dL (ref 0–40)

## 2021-02-05 LAB — HIV ANTIBODY (ROUTINE TESTING W REFLEX): HIV Screen 4th Generation wRfx: NONREACTIVE

## 2021-02-05 LAB — LDL CHOLESTEROL, DIRECT: Direct LDL: 152.5 mg/dL — ABNORMAL HIGH (ref 0–99)

## 2021-02-05 MED ORDER — DAPAGLIFLOZIN PROPANEDIOL 10 MG PO TABS: 10.0000 mg | ORAL_TABLET | Freq: Every day | ORAL | 1 refills | Status: DC

## 2021-02-05 MED ORDER — INSULIN GLARGINE 100 UNIT/ML SOLOSTAR PEN: 15.0000 [IU] | PEN_INJECTOR | Freq: Every day | SUBCUTANEOUS | 11 refills | Status: DC

## 2021-02-05 MED ORDER — POTASSIUM CHLORIDE CRYS ER 20 MEQ PO TBCR
40.0000 meq | EXTENDED_RELEASE_TABLET | Freq: Once | ORAL | Status: AC
  Administered 2021-02-05: 40 meq via ORAL
  Filled 2021-02-05: qty 2

## 2021-02-05 MED ORDER — INSULIN ASPART 100 UNIT/ML IJ SOLN
20.0000 [IU] | Freq: Once | INTRAMUSCULAR | Status: AC
  Administered 2021-02-05: 20 [IU] via SUBCUTANEOUS

## 2021-02-05 MED ORDER — HYDROCHLOROTHIAZIDE 25 MG PO TABS: 25.0000 mg | ORAL_TABLET | Freq: Every day | ORAL | 1 refills | Status: DC

## 2021-02-05 MED ORDER — INSULIN GLARGINE-YFGN 100 UNIT/ML ~~LOC~~ SOLN
10.0000 [IU] | Freq: Every day | SUBCUTANEOUS | Status: DC
  Administered 2021-02-05: 10 [IU] via SUBCUTANEOUS
  Filled 2021-02-05: qty 0.1

## 2021-02-05 MED ORDER — ROSUVASTATIN CALCIUM 10 MG PO TABS: 10.0000 mg | ORAL_TABLET | Freq: Every day | ORAL | 11 refills | Status: DC

## 2021-02-05 MED ORDER — INSULIN ASPART 100 UNIT/ML IJ SOLN
10.0000 [IU] | Freq: Three times a day (TID) | INTRAMUSCULAR | Status: DC
  Administered 2021-02-05 – 2021-02-07 (×6): 10 [IU] via SUBCUTANEOUS
  Filled 2021-02-05 (×5): qty 1

## 2021-02-05 MED ORDER — INSULIN GLARGINE-YFGN 100 UNIT/ML ~~LOC~~ SOLN
10.0000 [IU] | Freq: Two times a day (BID) | SUBCUTANEOUS | Status: DC
  Administered 2021-02-05 – 2021-02-06 (×2): 10 [IU] via SUBCUTANEOUS
  Filled 2021-02-05 (×3): qty 0.1

## 2021-02-05 MED ORDER — PEN NEEDLES 31G X 5 MM MISC: 15.0000 [IU] | Freq: Every day | 1 refills | Status: DC

## 2021-02-05 MED ORDER — ROSUVASTATIN CALCIUM 10 MG PO TABS
40.0000 mg | ORAL_TABLET | Freq: Every day | ORAL | Status: DC
  Administered 2021-02-05 – 2021-02-07 (×3): 40 mg via ORAL
  Filled 2021-02-05 (×2): qty 4

## 2021-02-05 MED ORDER — AMLODIPINE BESYLATE 10 MG PO TABS: 10.0000 mg | ORAL_TABLET | Freq: Every day | ORAL | 1 refills | Status: DC

## 2021-02-05 MED ORDER — BLOOD GLUCOSE MONITOR KIT: PACK | 0 refills | Status: AC

## 2021-02-05 MED ORDER — ASPIRIN 81 MG PO TBEC: 81.0000 mg | DELAYED_RELEASE_TABLET | Freq: Every day | ORAL | 12 refills | Status: AC

## 2021-02-05 MED ORDER — GLIMEPIRIDE 2 MG PO TABS: 2.0000 mg | ORAL_TABLET | ORAL | 11 refills | Status: DC

## 2021-02-05 NOTE — Progress Notes (Signed)
PROGRESS NOTE    David Klein  [id number] DOB: 22-Apr-1971 DOA: 02/04/2021 PCP: Pcp, No   Brief Narrative: Taken from H&P. David Klein is a 50 y.o. male with medical history significant of hypertension, diabetes mellitus, CKD stage IIIa, who presents with headache. Found to have markedly elevated blood pressure at 263/143 and blood glucose level above 500.  Admitted for hypertensive urgency and HHS.  Patient moved from [place] earlier in the year, used to take HCTZ and amlodipine for blood pressure along with metformin and Lantus for diabetes.  Ran out of his medications for the past couple of month, has not got established with a PCP locally.  He was only taking occasionally metformin as he still has some leftover pills.  CT head and MRI brain was done due to his complaint of some tingling and numbness and left eye vision changes.  They were negative for any acute changes.  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  There was some suspicion of open angle glaucoma with elevated eye pressures.  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  They are recommending outpatient ophthalmology follow-up for further recommendations at [facility] eye care.  Subjective: Patient was feeling much improved when seen during morning rounds.  Vision has been improved.  Denies any headache, tingling or numbness.  No other focal deficit.  We discussed about the importance of getting established with a primary care provider for better control of his chronic conditions.  Assessment & Plan:   Principal Problem:   Type 2 diabetes mellitus with hyperosmolar hyperglycemic state (HHS) ([place]) Active Problems:   Hypertensive emergency   Hypertension   Acute renal failure superimposed on stage 3a chronic kidney disease (HCC)   Type II diabetes  mellitus with renal manifestations (HCC)   Elevated troponin   Change in vision  Uncontrolled type 2 diabetes mellitus with HHS and renal manifestations.  A1c done more than a year ago was 7.3.  Patient was only taking metformin, used to take Lantus 25 units at bedtime. Ran out of his meds as he has not got established with PCP yet although moved many months ago. CBGs still elevated after stopping insulin infusion. -Add Smitley 10 units twice daily -Add NovoLog 10 units with meals -Continue with resistant SSI. -Planning to discharge with Amaryl, Lantus and Farxiga.  We will stop metformin due to borderline GFR at 30.  Hypertension with hypertension urgency.  Blood pressure still elevated but improved as compared to admission. -Continue with amlodipine and HCTZ -Continue with as needed labetalol and hydralazine.  AKI with CKD stage IIIb.  Baseline creatinine was 1.54 on 01/08/2019, it was above 3 on admission and improved to 2.59 this morning.  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  Might be this is his new baseline as he has not seen a physician since that time. -Monitor renal function -Avoid nephrotoxins -If remains stable we can add ARB.  Vision changes.  Most likely secondary to hyperglycemia.  Ophthalmology was consulted and there was some concern of open angle glaucoma with hypertensive and diabetic retinopathy.  They are recommending outpatient follow-up. Vision improved today.  Dyslipidemia.  Elevated total cholesterol, triglycerides and LDL.  ASCVD risk of 36.2%, which can be improved to 3.3% with risk factor modifications.  Recommending high intensity statin. -Start him on Crestor 40 mg daily.  Elevated troponin.  Mildly elevated troponin most likely secondary to demand ischemia.  No chest pain.  -Patient was started on low-dose aspirin and statin.  Objective: Vitals:   02/05/21 0900 02/05/21 0953 02/05/21 1000  02/05/21 1100  BP: (!) 154/81 (!) 188/98 (!) 160/91   Pulse: 96  (!) 101 (!) 108  Resp: 20  (!) 24 15  Temp:      TempSrc:      SpO2: 98%  98% 99%  Weight:      Height:        Intake/Output Summary (Last 24 hours) at 02/05/2021 1244 Last data filed at 02/05/2021 1225 Gross per 24 hour  Intake 4318.73 ml  Output 1800 ml  Net 2518.73 ml   Filed Weights   02/04/21 1204 02/04/21 1637  Weight: 99.8 kg 100.2 kg    Examination:  General exam: Appears calm and comfortable  Respiratory system: Clear to auscultation. Respiratory effort normal. Cardiovascular system: S1 & S2 heard, RRR. Marland Kitchen Gastrointestinal system: Soft, nontender, nondistended, bowel sounds positive. Central nervous system: Alert and oriented. No focal neurological deficits.Symmetric 5 x 5 power. Extremities: No edema, no cyanosis, pulses intact and symmetrical. Psychiatry: Judgement and insight appear normal. Mood & affect appropriate.    DVT prophylaxis: Lovenox Code Status: Full Family Communication: Discussed with patient Disposition Plan:  Status is: Inpatient  Remains inpatient appropriate because:Inpatient level of care appropriate due to severity of illness  Dispo: The patient is from: Home              Anticipated d/c is to: Home              Patient currently is not medically stable to d/c.   Difficult to place patient No              Level of care: MedSurg  All the records are reviewed and case discussed with Care Management/Social Worker. Management plans discussed with the patient, nursing and they are in agreement.  Consultants:  PCCM Ophthalmology  Procedures:  Antimicrobials:   Data Reviewed: I have personally reviewed following labs and imaging studies  CBC: Recent Labs  Lab 02/04/21 1223  WBC 9.5  NEUTROABS 5.7  HGB 12.8*  HCT 35.9*  MCV 86.9  PLT [id number]*   Basic Metabolic Panel: Recent Labs  Lab 02/04/21 2207 02/05/21 0002 02/05/21 0215 02/05/21 0552 02/05/21 1058  NA  135 135 138 139 133*  K 3.1* 3.2* 3.3* 3.9 4.3  CL 100 101 103 103 101  CO2 '27 26 29 29 25  '$ GLUCOSE 223* 192* 170* 219* 447*  BUN 25* 25* 24* 23* 24*  CREATININE 2.52* 2.41* 2.54* 2.59* 2.57*  CALCIUM 8.7* 8.5* 8.4* 8.5* 8.7*   GFR: Estimated Creatinine Clearance: 37.3 mL/min (A) (by C-G formula based on SCr of 2.57 mg/dL (H)). Liver Function Tests: Recent Labs  Lab 02/04/21 1223  AST 26  ALT 20  ALKPHOS 81  BILITOT 0.8  PROT 7.7  ALBUMIN 3.0*   No results for input(s): LIPASE, AMYLASE in the last 168 hours. No results for input(s): AMMONIA in the last 168 hours. Coagulation Profile: No results for input(s): INR, PROTIME in the last 168 hours. Cardiac Enzymes: No results for input(s): CKTOTAL, CKMB, CKMBINDEX, TROPONINI in the last 168 hours. BNP (last 3 results) No results for input(s): PROBNP in the last 8760 hours. HbA1C: No results for input(s): HGBA1C in the last 72 hours. CBG: Recent Labs  Lab 02/05/21 0108 02/05/21 0144 02/05/21 0733 02/05/21 1117 02/05/21  [place]   Lipid Profile: Recent Labs    02/05/21 0552  CHOL 281*  HDL 47  LDLCALC UNABLE TO CALCULATE IF TRIGLYCERIDE OVER 400 mg/dL  TRIG 442*  CHOLHDL 6.0   Thyroid Function Tests: No results for input(s): TSH, T4TOTAL, FREET4, T3FREE, THYROIDAB in the last 72 hours. Anemia Panel: No results for input(s): VITAMINB12, FOLATE, FERRITIN, TIBC, IRON, RETICCTPCT in the last 72 hours. Sepsis Labs: No results for input(s): PROCALCITON, LATICACIDVEN in the last 168 hours.  Recent Results (from the past 240 hour(s))  Resp Panel by RT-PCR (Flu A&B, Covid) Nasopharyngeal Swab     Status: None   Collection Time: 02/04/21  1:32 PM   Specimen: Nasopharyngeal Swab; Nasopharyngeal(NP) swabs in vial transport medium  Result Value Ref Range Status   SARS Coronavirus 2 by RT PCR NEGATIVE NEGATIVE Final    Comment: (NOTE) SARS-CoV-2 target nucleic acids are NOT DETECTED.  The  SARS-CoV-2 RNA is generally detectable in upper respiratory specimens during the acute phase of infection. The lowest concentration of SARS-CoV-2 viral copies this assay can detect is 138 copies/mL. A negative result does not preclude SARS-Cov-2 infection and should not be used as the sole basis for treatment or other patient management decisions. A negative result may occur with  improper specimen collection/handling, submission of specimen other than nasopharyngeal swab, presence of viral mutation(s) within the areas targeted by this assay, and inadequate number of viral copies(<138 copies/mL). A negative result must be combined with clinical observations, patient history, and epidemiological information. The expected result is Negative.  Fact Sheet for Patients:  EntrepreneurPulse.com.au  Fact Sheet for Healthcare Providers:  IncredibleEmployment.be  This test is no t yet approved or cleared by the [place] FDA and  has been authorized for detection and/or diagnosis of SARS-CoV-2 by FDA under an Emergency Use Authorization (EUA). This EUA will remain  in effect (meaning this test can be used) for the duration of the COVID-19 declaration under Section 564(b)(1) of the Act, 21 U.S.C.section 360bbb-3(b)(1), unless the authorization is terminated  or revoked sooner.       Influenza A by PCR NEGATIVE NEGATIVE Final   Influenza B by PCR NEGATIVE NEGATIVE Final    Comment: (NOTE) The Xpert Xpress SARS-CoV-2/FLU/RSV plus assay is intended as an aid in the diagnosis of influenza from Nasopharyngeal swab specimens and should not be used as a sole basis for treatment. Nasal washings and aspirates are unacceptable for Xpert Xpress SARS-CoV-2/FLU/RSV testing.  Fact Sheet for Patients: EntrepreneurPulse.com.au  Fact Sheet for Healthcare Providers: IncredibleEmployment.be  This test is not yet approved or  cleared by the [place] FDA and has been authorized for detection and/or diagnosis of SARS-CoV-2 by FDA under an Emergency Use Authorization (EUA). This EUA will remain in effect (meaning this test can be used) for the duration of the COVID-19 declaration under Section 564(b)(1) of the Act, 21 U.S.C. section 360bbb-3(b)(1), unless the authorization is terminated or revoked.  Performed at [facility], [address] [place], [place] [address]   MRSA Next Gen by PCR, Nasal     Status: None   Collection Time: 02/04/21  4:38 PM   Specimen: Nasal Mucosa; Nasal Swab  Result Value Ref Range Status   MRSA by PCR Next Gen NOT DETECTED NOT DETECTED Final    Comment: (NOTE) The GeneXpert MRSA Assay (FDA approved for NASAL specimens only), is one component of a comprehensive MRSA colonization surveillance program. It is not intended to  diagnose MRSA infection nor to guide or monitor treatment for MRSA infections. Test performance is not FDA approved in patients less than 71 years old. Performed at [facility], [address] [place], [place] [address]      Radiology Studies: CT HEAD WO CONTRAST (5MM)  Result Date: 02/04/2021 CLINICAL DATA:  Hypertension, headache and dizziness for couple days EXAM: CT HEAD WITHOUT CONTRAST TECHNIQUE: Contiguous axial images were obtained from the base of the skull through the vertex without intravenous contrast. Sagittal and coronal MPR images reconstructed from axial data set. COMPARISON:  None FINDINGS: Brain: Normal ventricular morphology. No midline shift or mass effect. Normal appearance of brain parenchyma. No intracranial hemorrhage, mass lesion, or evidence of acute infarction. No extra-axial fluid collections. Vascular: No hyperdense vessels. Mild atherosclerotic calcification of internal carotid arteries at skull base Skull: Intact Sinuses/Orbits: Clear Other: N/A IMPRESSION: No acute intracranial abnormalities. Electronically  Signed   By: Lavonia Dana M.D.   On: 02/04/2021 13:27   MR BRAIN WO CONTRAST  Result Date: 02/04/2021 CLINICAL DATA:  Neuro deficit, acute, stroke suspected. Additional history provided: Patient with history of hypertension, diabetes mellitus, CKD, presenting with headache. EXAM: MRI HEAD WITHOUT CONTRAST TECHNIQUE: Multiplanar, multiecho pulse sequences of the brain and surrounding structures were obtained without intravenous contrast. COMPARISON:  Head CT 02/04/2021. FINDINGS: Brain: Cerebral volume is normal for age. No cortical encephalomalacia is identified. Mild multifocal T2 FLAIR hyperintense signal abnormality within the cerebral white matter, nonspecific but compatible with chronic small vessel ischemic disease. There is no acute infarct. No evidence of an intracranial mass. No chronic intracranial blood products. No extra-axial fluid collection. No midline shift. Vascular: Maintained flow voids within the proximal large arterial vessels. Skull and upper cervical spine: No focal suspicious marrow lesion. Sinuses/Orbits: Visualized orbits show no acute finding. Mild mucosal thickening within the right maxillary sinus. IMPRESSION: No evidence of acute intracranial abnormality. Mild chronic small vessel ischemic changes within the cerebral white matter. Mild mucosal thickening within the right maxillary sinus. Electronically Signed   By: Kellie Simmering D.O.   On: 02/04/2021 16:10   US RENAL  Result Date: 02/04/2021 CLINICAL DATA:  Acute kidney injury EXAM: RENAL / URINARY TRACT ULTRASOUND COMPLETE COMPARISON:  None. FINDINGS: Right Kidney: Renal measurements: 12.8 x 7.6 x 5.4 cm = volume: 290.6 mL. Echogenicity within normal limits. No mass or hydronephrosis visualized. Left Kidney: Renal measurements: 12.5 x 6.8 x 6.6 cm = volume: 2092.6 mL. Echogenicity within normal limits. No hydronephrosis visualized. 11 x 5 x 8 mm echogenic focus in the left kidney the which may reflect a small echogenic mass or  dystrophic calcification. Bladder: Appears normal for degree of bladder distention. Other: Increased hepatic echogenicity as can be seen with hepatic steatosis. IMPRESSION: 1. No obstructive uropathy. 2. Increased hepatic echogenicity as can be seen with hepatic steatosis. 3. A 11 x 5 x 8 mm echogenic focus in the left kidney the which may reflect a small echogenic mass such as an angiomyolipoma or dystrophic calcification. Electronically Signed   By: Kathreen Devoid M.D.   On: 02/04/2021 15:42   DG Chest Portable 1 View  Result Date: 02/04/2021 CLINICAL DATA:  Chest pain EXAM: PORTABLE CHEST 1 VIEW COMPARISON:  None. FINDINGS: The mediastinal contour is normal. The heart size is mildly enlarged. Both lungs are clear. The visualized skeletal structures are unremarkable. IMPRESSION: No active disease. Electronically Signed   By: Abelardo Diesel M.D.   On: 02/04/2021 13:43    Scheduled Meds:  amLODipine  10 mg Oral Daily   aspirin EC  81 mg Oral Daily   Chlorhexidine Gluconate Cloth  6 each Topical Q0600   hydrochlorothiazide  25 mg Oral Daily   insulin aspart  0-20 Units Subcutaneous TID WC   insulin aspart  0-5 Units Subcutaneous QHS   insulin aspart  10 Units Subcutaneous TID WC   insulin glargine-yfgn  10 Units Subcutaneous BID   Continuous Infusions:  lactated ringers Stopped (02/04/21 1959)     LOS: 1 day   Time spent: 38 minutes. More than 50% of the time was spent in counseling/coordination of care  Lorella Nimrod, MD [facility]  If 7PM-7AM, please contact night-coverage [email or web address]  02/05/2021, 12:44 PM   This record has been created using Systems analyst. Errors have been sought and corrected,but may not always be located. Such creation errors do not reflect on the standard of care.

## 2021-02-05 NOTE — Discharge Summary (Signed)
Physician Discharge Summary  David Klein ZOX:[id number] DOB: 03-Dec-1970 DOA: 02/04/2021  PCP: Merryl Hacker, No  Admit date: 02/04/2021 Discharge date: 02/07/2021  Admitted From: Home Disposition: Home  Recommendations for Outpatient Follow-up:  Follow up with PCP in 1-2 weeks Follow-up with nephrology Follow-up with [facility] Please obtain BMP/CBC in one week Please follow up on the following pending results: None  Home Health: No Equipment/Devices: None Discharge Condition: Stable CODE STATUS: Full Diet recommendation: Heart Healthy / Carb Modified   Brief/Interim Summary: David Klein is a 50 y.o. male with medical history significant of hypertension, diabetes mellitus, CKD stage IIIa, who presents with headache. Found to have markedly elevated blood pressure at 263/143 and blood glucose level above 500.  Admitted for hypertensive urgency and HHS.   Patient moved from [place] earlier in the year, used to take HCTZ and amlodipine for blood pressure along with metformin and Lantus for diabetes.  Ran out of his medications for the past couple of month, has not got established with a PCP locally.  He was only taking occasionally metformin as he still has some leftover pills.   CT head and MRI brain was done due to his complaint of some tingling and numbness and left eye vision changes.  They were negative for any acute changes.  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  There was some suspicion of open angle glaucoma with elevated eye pressures.  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  They are recommending outpatient ophthalmology follow-up for further recommendations at [facility].  Patient had very uncontrolled diabetes with hyperglycemia and A1c above 15.5.  Blood glucose level improved with basal and  short-acting while in the hospital.  We stopped his home dose of metformin due to borderline renal function and he was started on Farxiga, Amaryl and Lantus 15 units twice daily.  He was advised to have a close follow-up with PCP, and new PCP appointment was set for next Monday.  His blood pressure remained elevated and we also involve nephrology due to worsening renal function.  Most likely having a new baseline creatinine of 2.7-2.9.  We discontinued his home dose of HCTZ which he was not taking for many months, start him on amlodipine, low-dose losartan, torsemide and hydralazine.  He needs to have a close follow-up with nephrology for further recommendations.  He was also found to have dyslipidemia and his ASCVD was about 36% which can be reduced to 3.3% with the risk modifications.  He was also started on high intensity statin with Crestor 40 mg daily and 81 mg of aspirin.  Patient should continue with current medication and have a close follow-up with PCP and nephrology.   Discharge Diagnoses:  Principal Problem:   Type 2 diabetes mellitus with hyperosmolar hyperglycemic state (HHS) ([place]) Active Problems:   Hypertensive emergency   Hypertension   Acute renal failure superimposed on stage 3a chronic kidney disease (HCC)   Type II diabetes mellitus with renal manifestations (HCC)   Elevated troponin   Change in vision   Discharge Instructions  Discharge Instructions     Diet - low sodium heart healthy   Complete by: As directed    Discharge instructions   Complete by: As directed    It was pleasure taking care of you. It is very important that you get established with a PCP locally. Please have  your kidney functions checked in 1 week. We are making some changes to your medications, stop taking metformin due to worsening kidney function, start taking Amaryl, Farxiga and Lantus at 15 units at bedtime. Keep checking your blood glucose level regularly as Amaryl and Lantus together  can cause low blood sugars.  Do not miss any meals but watch for your carbohydrate content. We are restarting your amlodipine and HCTZ, your primary care doctor can monitor your kidney function and blood pressure and make adjustments as needed. You are also being started on low-dose aspirin and Crestor for your high cholesterol. Keep your self well-hydrated.   Discharge instructions   Complete by: As directed    It was pleasure taking care of you. You are being started on multiple new medications for better control of your high blood pressure and diabetes. Please take them as directed. We are stopping your home dose of HCTZ and metformin. Your nephrologist started you on low-dose torsemide to help with your swelling. We are starting you on glipizide and Farxiga along with Lantus 15 units twice a day for your uncontrolled diabetes, your A1c is above 15.5.  Do not miss any meals as taking glipizide and Lantus together can lower your blood sugar, please check your blood sugar levels 3-4 times a day and take your glucometer to your PCP appointment for further recommendations. It is very important that you get established with PCP and have a close follow-up with kidney doctor to prevent further damage to your kidney.   Increase activity slowly   Complete by: As directed    Increase activity slowly   Complete by: As directed       Allergies as of 02/07/2021   No Known Allergies      Medication List     STOP taking these medications    hydrochlorothiazide 25 MG tablet Commonly known as: HYDRODIURIL   metFORMIN 1000 MG tablet Commonly known as: GLUCOPHAGE       TAKE these medications    amLODipine 10 MG tablet Commonly known as: NORVASC Take 1 tablet (10 mg total) by mouth daily.   aspirin 81 MG EC tablet Take 1 tablet (81 mg total) by mouth daily.   blood glucose meter kit and supplies Kit Dispense based on patient and insurance preference. Use up to four times daily as  directed.   dapagliflozin propanediol 10 MG Tabs tablet Commonly known as: FARXIGA Take 1 tablet (10 mg total) by mouth daily before breakfast.   glimepiride 2 MG tablet Commonly known as: Amaryl Take 1 tablet (2 mg total) by mouth every morning.   hydrALAZINE 50 MG tablet Commonly known as: APRESOLINE Take 1 tablet (50 mg total) by mouth every 8 (eight) hours.   insulin glargine 100 UNIT/ML Solostar Pen Commonly known as: LANTUS Inject 15 Units into the skin 2 (two) times daily. What changed:  how much to take when to take this   losartan 25 MG tablet Commonly known as: COZAAR Take 1 tablet (25 mg total) by mouth daily.   Pen Needles 31G X 5 MM Misc 15 Units by Does not apply route at bedtime.   rosuvastatin 40 MG tablet Commonly known as: CRESTOR Take 1 tablet (40 mg total) by mouth daily. Start taking on: February 08, 2021   torsemide 20 MG tablet Commonly known as: DEMADEX Take 1 tablet (20 mg total) by mouth daily. Start taking on: February 08, 2021        Follow-up Information  Mechele Claude, FNP. Go on 02/13/2021.   Specialty: Family Medicine Why: Arrive at [facility]. ID, and any medications in their orginal bottles Contact information: [place] [address] [phone]                No Known Allergies  Consultations: Nephrology Ophthalmology  Procedures/Studies: CT HEAD WO CONTRAST (5MM)  Result Date: 02/04/2021 CLINICAL DATA:  Hypertension, headache and dizziness for couple days EXAM: CT HEAD WITHOUT CONTRAST TECHNIQUE: Contiguous axial images were obtained from the base of the skull through the vertex without intravenous contrast. Sagittal and coronal MPR images reconstructed from axial data set. COMPARISON:  None FINDINGS: Brain: Normal ventricular morphology. No midline shift or mass effect. Normal appearance of brain parenchyma. No intracranial hemorrhage, mass lesion, or evidence of acute  infarction. No extra-axial fluid collections. Vascular: No hyperdense vessels. Mild atherosclerotic calcification of internal carotid arteries at skull base Skull: Intact Sinuses/Orbits: Clear Other: N/A IMPRESSION: No acute intracranial abnormalities. Electronically Signed   By: Lavonia Dana M.D.   On: 02/04/2021 13:27   MR BRAIN WO CONTRAST  Result Date: 02/04/2021 CLINICAL DATA:  Neuro deficit, acute, stroke suspected. Additional history provided: Patient with history of hypertension, diabetes mellitus, CKD, presenting with headache. EXAM: MRI HEAD WITHOUT CONTRAST TECHNIQUE: Multiplanar, multiecho pulse sequences of the brain and surrounding structures were obtained without intravenous contrast. COMPARISON:  Head CT 02/04/2021. FINDINGS: Brain: Cerebral volume is normal for age. No cortical encephalomalacia is identified. Mild multifocal T2 FLAIR hyperintense signal abnormality within the cerebral white matter, nonspecific but compatible with chronic small vessel ischemic disease. There is no acute infarct. No evidence of an intracranial mass. No chronic intracranial blood products. No extra-axial fluid collection. No midline shift. Vascular: Maintained flow voids within the proximal large arterial vessels. Skull and upper cervical spine: No focal suspicious marrow lesion. Sinuses/Orbits: Visualized orbits show no acute finding. Mild mucosal thickening within the right maxillary sinus. IMPRESSION: No evidence of acute intracranial abnormality. Mild chronic small vessel ischemic changes within the cerebral white matter. Mild mucosal thickening within the right maxillary sinus. Electronically Signed   By: Kellie Simmering D.O.   On: 02/04/2021 16:10   US RENAL  Result Date: 02/04/2021 CLINICAL DATA:  Acute kidney injury EXAM: RENAL / URINARY TRACT ULTRASOUND COMPLETE COMPARISON:  None. FINDINGS: Right Kidney: Renal measurements: 12.8 x 7.6 x 5.4 cm = volume: 290.6 mL. Echogenicity within normal limits. No mass  or hydronephrosis visualized. Left Kidney: Renal measurements: 12.5 x 6.8 x 6.6 cm = volume: 2092.6 mL. Echogenicity within normal limits. No hydronephrosis visualized. 11 x 5 x 8 mm echogenic focus in the left kidney the which may reflect a small echogenic mass or dystrophic calcification. Bladder: Appears normal for degree of bladder distention. Other: Increased hepatic echogenicity as can be seen with hepatic steatosis. IMPRESSION: 1. No obstructive uropathy. 2. Increased hepatic echogenicity as can be seen with hepatic steatosis. 3. A 11 x 5 x 8 mm echogenic focus in the left kidney the which may reflect a small echogenic mass such as an angiomyolipoma or dystrophic calcification. Electronically Signed   By: Kathreen Devoid M.D.   On: 02/04/2021 15:42   DG Chest Portable 1 View  Result Date: 02/04/2021 CLINICAL DATA:  Chest pain EXAM: PORTABLE CHEST 1 VIEW COMPARISON:  None. FINDINGS: The mediastinal contour is normal. The heart size is mildly enlarged. Both lungs are clear. The visualized skeletal structures are unremarkable. IMPRESSION: No active disease. Electronically Signed  By: Abelardo Diesel M.D.   On: 02/04/2021 13:43    Subjective: Patient was seen and examined today.  No new complaints.  Wants to go home.  Vision improved.  Lower extremity swelling improving.  Discharge Exam: Vitals:   02/07/21 0501 02/07/21 0742  BP: (!) 167/95 (!) 164/87  Pulse: 87 98  Resp: 20 17  Temp: 98 F (36.7 C) 98.6 F (37 C)  SpO2: 99% 94%   Vitals:   02/06/21 2024 02/06/21 2255 02/07/21 0501 02/07/21 0742  BP: (!) 185/111 (!) 144/88 (!) 167/95 (!) 164/87  Pulse: (!) 102 (!) 103 87 98  Resp: $Remo'20  20 17  'zcNeb$ Temp: 98.6 F (37 C)  98 F (36.7 C) 98.6 F (37 C)  TempSrc: Oral  Oral Oral  SpO2: 100%  99% 94%  Weight:      Height:        General: Pt is alert, awake, not in acute distress Cardiovascular: RRR, S1/S2 +, no rubs, no gallops Respiratory: CTA bilaterally, no wheezing, no rhonchi Abdominal:  Soft, NT, ND, bowel sounds + Extremities: Trace LE edema, no cyanosis   The results of significant diagnostics from this hospitalization (including imaging, microbiology, ancillary and laboratory) are listed below for reference.    Microbiology: Recent Results (from the past 240 hour(s))  Resp Panel by RT-PCR (Flu A&B, Covid) Nasopharyngeal Swab     Status: None   Collection Time: 02/04/21  1:32 PM   Specimen: Nasopharyngeal Swab; Nasopharyngeal(NP) swabs in vial transport medium  Result Value Ref Range Status   SARS Coronavirus 2 by RT PCR NEGATIVE NEGATIVE Final    Comment: (NOTE) SARS-CoV-2 target nucleic acids are NOT DETECTED.  The SARS-CoV-2 RNA is generally detectable in upper respiratory specimens during the acute phase of infection. The lowest concentration of SARS-CoV-2 viral copies this assay can detect is 138 copies/mL. A negative result does not preclude SARS-Cov-2 infection and should not be used as the sole basis for treatment or other patient management decisions. A negative result may occur with  improper specimen collection/handling, submission of specimen other than nasopharyngeal swab, presence of viral mutation(s) within the areas targeted by this assay, and inadequate number of viral copies(<138 copies/mL). A negative result must be combined with clinical observations, patient history, and epidemiological information. The expected result is Negative.  Fact Sheet for Patients:  EntrepreneurPulse.com.au  Fact Sheet for Healthcare Providers:  IncredibleEmployment.be  This test is no t yet approved or cleared by the [place] FDA and  has been authorized for detection and/or diagnosis of SARS-CoV-2 by FDA under an Emergency Use Authorization (EUA). This EUA will remain  in effect (meaning this test can be used) for the duration of the COVID-19 declaration under Section 564(b)(1) of the Act, 21 U.S.C.section  360bbb-3(b)(1), unless the authorization is terminated  or revoked sooner.       Influenza A by PCR NEGATIVE NEGATIVE Final   Influenza B by PCR NEGATIVE NEGATIVE Final    Comment: (NOTE) The Xpert Xpress SARS-CoV-2/FLU/RSV plus assay is intended as an aid in the diagnosis of influenza from Nasopharyngeal swab specimens and should not be used as a sole basis for treatment. Nasal washings and aspirates are unacceptable for Xpert Xpress SARS-CoV-2/FLU/RSV testing.  Fact Sheet for Patients: EntrepreneurPulse.com.au  Fact Sheet for Healthcare Providers: IncredibleEmployment.be  This test is not yet approved or cleared by the [place] FDA and has been authorized for detection and/or diagnosis of SARS-CoV-2 by FDA under an Emergency Use Authorization (EUA). This  EUA will remain in effect (meaning this test can be used) for the duration of the COVID-19 declaration under Section 564(b)(1) of the Act, 21 U.S.C. section 360bbb-3(b)(1), unless the authorization is terminated or revoked.  Performed at [facility], [address] [place], [place] [address]   MRSA Next Gen by PCR, Nasal     Status: None   Collection Time: 02/04/21  4:38 PM   Specimen: Nasal Mucosa; Nasal Swab  Result Value Ref Range Status   MRSA by PCR Next Gen NOT DETECTED NOT DETECTED Final    Comment: (NOTE) The GeneXpert MRSA Assay (FDA approved for NASAL specimens only), is one component of a comprehensive MRSA colonization surveillance program. It is not intended to diagnose MRSA infection nor to guide or monitor treatment for MRSA infections. Test performance is not FDA approved in patients less than 87 years old. Performed at [facility], [address] [place], [place] [address]      Labs: BNP (last 3 results) No results for input(s): BNP in the last 8760 hours. Basic Metabolic Panel: Recent Labs  Lab 02/05/21 0215 02/05/21 0552  02/05/21 1058 02/06/21 0955 02/07/21 0453  NA 138 139 133* 135 133*  K 3.3* 3.9 4.3 4.0 3.4*  CL 103 103 101 100 100  CO2 $Re'29 29 25 27 23  'MsC$ GLUCOSE 170* 219* 447* 218* 123*  BUN 24* 23* 24* 28* 36*  CREATININE 2.54* 2.59* 2.57* 2.90* 2.85*  CALCIUM 8.4* 8.5* 8.7* 9.7 9.2   Liver Function Tests: Recent Labs  Lab 02/04/21 1223  AST 26  ALT 20  ALKPHOS 81  BILITOT 0.8  PROT 7.7  ALBUMIN 3.0*   No results for input(s): LIPASE, AMYLASE in the last 168 hours. No results for input(s): AMMONIA in the last 168 hours. CBC: Recent Labs  Lab 02/04/21 1223  WBC 9.5  NEUTROABS 5.7  HGB 12.8*  HCT 35.9*  MCV 86.9  PLT 403*   Cardiac Enzymes: No results for input(s): CKTOTAL, CKMB, CKMBINDEX, TROPONINI in the last 168 hours. BNP: Invalid input(s): POCBNP CBG: Recent Labs  Lab 02/06/21 1118 02/06/21 1634 02/06/21 2051 02/07/21 0742 02/07/21 1126  GLUCAP 287* 275* 276* 162* 238*   D-Dimer No results for input(s): DDIMER in the last 72 hours. Hgb A1c Recent Labs    02/04/21 1726  HGBA1C >15.5*   Lipid Profile Recent Labs    02/05/21 0552  CHOL 281*  HDL 47  LDLCALC UNABLE TO CALCULATE IF TRIGLYCERIDE OVER 400 mg/dL  TRIG 442*  CHOLHDL 6.0  LDLDIRECT 152.5*   Thyroid function studies No results for input(s): TSH, T4TOTAL, T3FREE, THYROIDAB in the last 72 hours.  Invalid input(s): FREET3 Anemia work up No results for input(s): VITAMINB12, FOLATE, FERRITIN, TIBC, IRON, RETICCTPCT in the last 72 hours. Urinalysis No results found for: COLORURINE, APPEARANCEUR, [place], [place], GLUCOSEU, [place], [place], [place], PROTEINUR, UROBILINOGEN, NITRITE, LEUKOCYTESUR Sepsis Labs Invalid input(s): PROCALCITONIN,  WBC,  LACTICIDVEN Microbiology Recent Results (from the past 240 hour(s))  Resp Panel by RT-PCR (Flu A&B, Covid) Nasopharyngeal Swab     Status: None   Collection Time: 02/04/21  1:32 PM   Specimen: Nasopharyngeal Swab; Nasopharyngeal(NP) swabs in vial  transport medium  Result Value Ref Range Status   SARS Coronavirus 2 by RT PCR NEGATIVE NEGATIVE Final    Comment: (NOTE) SARS-CoV-2 target nucleic acids are NOT DETECTED.  The SARS-CoV-2 RNA is generally detectable in upper respiratory specimens during the acute phase of infection. The lowest concentration of SARS-CoV-2 viral copies  this assay can detect is 138 copies/mL. A negative result does not preclude SARS-Cov-2 infection and should not be used as the sole basis for treatment or other patient management decisions. A negative result may occur with  improper specimen collection/handling, submission of specimen other than nasopharyngeal swab, presence of viral mutation(s) within the areas targeted by this assay, and inadequate number of viral copies(<138 copies/mL). A negative result must be combined with clinical observations, patient history, and epidemiological information. The expected result is Negative.  Fact Sheet for Patients:  EntrepreneurPulse.com.au  Fact Sheet for Healthcare Providers:  IncredibleEmployment.be  This test is no t yet approved or cleared by the [place] FDA and  has been authorized for detection and/or diagnosis of SARS-CoV-2 by FDA under an Emergency Use Authorization (EUA). This EUA will remain  in effect (meaning this test can be used) for the duration of the COVID-19 declaration under Section 564(b)(1) of the Act, 21 U.S.C.section 360bbb-3(b)(1), unless the authorization is terminated  or revoked sooner.       Influenza A by PCR NEGATIVE NEGATIVE Final   Influenza B by PCR NEGATIVE NEGATIVE Final    Comment: (NOTE) The Xpert Xpress SARS-CoV-2/FLU/RSV plus assay is intended as an aid in the diagnosis of influenza from Nasopharyngeal swab specimens and should not be used as a sole basis for treatment. Nasal washings and aspirates are unacceptable for Xpert Xpress SARS-CoV-2/FLU/RSV testing.  Fact  Sheet for Patients: EntrepreneurPulse.com.au  Fact Sheet for Healthcare Providers: IncredibleEmployment.be  This test is not yet approved or cleared by the [place] FDA and has been authorized for detection and/or diagnosis of SARS-CoV-2 by FDA under an Emergency Use Authorization (EUA). This EUA will remain in effect (meaning this test can be used) for the duration of the COVID-19 declaration under Section 564(b)(1) of the Act, 21 U.S.C. section 360bbb-3(b)(1), unless the authorization is terminated or revoked.  Performed at [facility], [address] [place], [place] [address]   MRSA Next Gen by PCR, Nasal     Status: None   Collection Time: 02/04/21  4:38 PM   Specimen: Nasal Mucosa; Nasal Swab  Result Value Ref Range Status   MRSA by PCR Next Gen NOT DETECTED NOT DETECTED Final    Comment: (NOTE) The GeneXpert MRSA Assay (FDA approved for NASAL specimens only), is one component of a comprehensive MRSA colonization surveillance program. It is not intended to diagnose MRSA infection nor to guide or monitor treatment for MRSA infections. Test performance is not FDA approved in patients less than 22 years old. Performed at [facility], [address] [place], [place] [address]     Time coordinating discharge: Over 30 minutes  SIGNED:  Lorella Nimrod, MD  [facility] 02/07/2021, 11:42 AM  If 7PM-7AM, please contact night-coverage [email or web address]  This record has been created using Systems analyst. Errors have been sought and corrected,but may not always be located. Such creation errors do not reflect on the standard of care.

## 2021-02-05 NOTE — Consult Note (Signed)
Reason for Consult:blurred vision left eye Referring Physician: [name] is an 50 y.o. male.  Chief complaint: prism vision changes OS Type 2 diabetes mellitus with hyperosmolar hyperglycemic state (HHS) ([place])  HPI: 50 yo BM  c/o two months intermittent visual changes in left eye with temporal VF affected by color/ prism changes in VF.  Lasts about 15-20 minutes and no HA, nausea, vomiting.  Resolves to normal between episodes.  Was admitted to [facility] yesterday with blurred vision and HTN (BP 263/140) and hyperglycemia (glc 455).  Pt gets yearly eye exams and was told mild diabetic changes but nothing needing treatment at last exam in 2021. CT and MRI head without signs of CVA. Vision improving this AM  Past Medical History:  Diagnosis Date   Diabetes ([place])    Hypertension     ROS  Past Surgical History:  Procedure Laterality Date   liver biopgy N/A     Family History  Problem Relation Age of Onset   Hypertension Mother    Hypertension Father     Social History:  reports that he has never smoked. He has never used smokeless tobacco. He reports that he does not currently use alcohol. He reports that he does not use drugs.  Allergies: No Known Allergies  Medications: I have reviewed the patient's current medications.  Results for orders placed or performed during the hospital encounter of 02/04/21 (from the past 48 hour(s))  Comprehensive metabolic panel     Status: Abnormal   Collection Time: 02/04/21 12:23 PM  Result Value Ref Range   Sodium 128 (L) 135 - 145 mmol/L   Potassium 3.6 3.5 - 5.1 mmol/L   Chloride 91 (L) 98 - 111 mmol/L   CO2 25 22 - 32 mmol/L   Glucose, Bld 546 (HH) 70 - 99 mg/dL    Comment: CRITICAL RESULT CALLED TO, READ BACK BY AND VERIFIED WITH ANNIE SMITH RN '@[id number]'$  02/04/21 SCS Glucose reference range applies only to samples taken after fasting for at least 8 hours.    BUN 28 (H) 6 - 20 mg/dL   Creatinine, Ser 3.09 (H) 0.61 - 1.24 mg/dL    Calcium 9.1 8.9 - 10.3 mg/dL   Total Protein 7.7 6.5 - 8.1 g/dL   Albumin 3.0 (L) 3.5 - 5.0 g/dL   AST 26 15 - 41 U/L   ALT 20 0 - 44 U/L   Alkaline Phosphatase 81 38 - 126 U/L   Total Bilirubin 0.8 0.3 - 1.2 mg/dL   GFR, Estimated 24 (L) >60 mL/min    Comment: (NOTE) Calculated using the CKD-EPI Creatinine Equation (2021)    Anion gap 12 5 - 15    Comment: Performed at [facility], [place], [place] [address]  Troponin I (High Sensitivity)     Status: Abnormal   Collection Time: 02/04/21 12:23 PM  Result Value Ref Range   Troponin I (High Sensitivity) 26 (H) <18 ng/L    Comment: (NOTE) Elevated high sensitivity troponin I (hsTnI) values and significant  changes across serial measurements may suggest ACS but many other  chronic and acute conditions are known to elevate hsTnI results.  Refer to the "Links" section for chest pain algorithms and additional  guidance. Performed at [facility], [address] [place], [place] [address]   CBC with Differential     Status: Abnormal   Collection Time: 02/04/21 12:23 PM  Result Value Ref Range   WBC 9.5 4.0 - 10.5  K/uL   RBC 4.13 (L) 4.22 - 5.81 MIL/uL   Hemoglobin 12.8 (L) 13.0 - 17.0 g/dL   HCT 35.9 (L) 39.0 - 52.0 %   MCV 86.9 80.0 - 100.0 fL   MCH 31.0 26.0 - 34.0 pg   MCHC 35.7 30.0 - 36.0 g/dL   RDW 12.7 11.5 - 15.5 %   Platelets 403 (H) 150 - 400 K/uL   nRBC 0.2 0.0 - 0.2 %   Neutrophils Relative % 59 %   Neutro Abs 5.7 1.7 - 7.7 K/uL   Lymphocytes Relative 32 %   Lymphs Abs 3.0 0.7 - 4.0 K/uL   Monocytes Relative 7 %   Monocytes Absolute 0.6 0.1 - 1.0 K/uL   Eosinophils Relative 1 %   Eosinophils Absolute 0.1 0.0 - 0.5 K/uL   Basophils Relative 0 %   Basophils Absolute 0.0 0.0 - 0.1 K/uL   Immature Granulocytes 1 %   Abs Immature Granulocytes 0.08 (H) 0.00 - 0.07 K/uL    Comment: Performed at [facility], [address], [place], [place] [address]  Resp Panel by  RT-PCR (Flu A&B, Covid) Nasopharyngeal Swab     Status: None   Collection Time: 02/04/21  1:32 PM   Specimen: Nasopharyngeal Swab; Nasopharyngeal(NP) swabs in vial transport medium  Result Value Ref Range   SARS Coronavirus 2 by RT PCR NEGATIVE NEGATIVE    Comment: (NOTE) SARS-CoV-2 target nucleic acids are NOT DETECTED.  The SARS-CoV-2 RNA is generally detectable in upper respiratory specimens during the acute phase of infection. The lowest concentration of SARS-CoV-2 viral copies this assay can detect is 138 copies/mL. A negative result does not preclude SARS-Cov-2 infection and should not be used as the sole basis for treatment or other patient management decisions. A negative result may occur with  improper specimen collection/handling, submission of specimen other than nasopharyngeal swab, presence of viral mutation(s) within the areas targeted by this assay, and inadequate number of viral copies(<138 copies/mL). A negative result must be combined with clinical observations, patient history, and epidemiological information. The expected result is Negative.  Fact Sheet for Patients:  EntrepreneurPulse.com.au  Fact Sheet for Healthcare Providers:  IncredibleEmployment.be  This test is no t yet approved or cleared by the [place] FDA and  has been authorized for detection and/or diagnosis of SARS-CoV-2 by FDA under an Emergency Use Authorization (EUA). This EUA will remain  in effect (meaning this test can be used) for the duration of the COVID-19 declaration under Section 564(b)(1) of the Act, 21 U.S.C.section 360bbb-3(b)(1), unless the authorization is terminated  or revoked sooner.       Influenza A by PCR NEGATIVE NEGATIVE   Influenza B by PCR NEGATIVE NEGATIVE    Comment: (NOTE) The Xpert Xpress SARS-CoV-2/FLU/RSV plus assay is intended as an aid in the diagnosis of influenza from Nasopharyngeal swab specimens and should not be  used as a sole basis for treatment. Nasal washings and aspirates are unacceptable for Xpert Xpress SARS-CoV-2/FLU/RSV testing.  Fact Sheet for Patients: EntrepreneurPulse.com.au  Fact Sheet for Healthcare Providers: IncredibleEmployment.be  This test is not yet approved or cleared by the [place] FDA and has been authorized for detection and/or diagnosis of SARS-CoV-2 by FDA under an Emergency Use Authorization (EUA). This EUA will remain in effect (meaning this test can be used) for the duration of the COVID-19 declaration under Section 564(b)(1) of the Act, 21 U.S.C. section 360bbb-3(b)(1), unless the authorization is terminated or revoked.  Performed at [facility],  [place], [place] [address]   CBG monitoring, ED     Status: Abnormal   Collection Time: 02/04/21  3:25 PM  Result Value Ref Range   Glucose-Capillary 473 (H) 70 - 99 mg/dL    Comment: Glucose reference range applies only to samples taken after fasting for at least 8 hours.  MRSA Next Gen by PCR, Nasal     Status: None   Collection Time: 02/04/21  4:38 PM   Specimen: Nasal Mucosa; Nasal Swab  Result Value Ref Range   MRSA by PCR Next Gen NOT DETECTED NOT DETECTED    Comment: (NOTE) The GeneXpert MRSA Assay (FDA approved for NASAL specimens only), is one component of a comprehensive MRSA colonization surveillance program. It is not intended to diagnose MRSA infection nor to guide or monitor treatment for MRSA infections. Test performance is not FDA approved in patients less than 60 years old. Performed at [facility], [address] [place], [place] [address]   Glucose, capillary     Status: Abnormal   Collection Time: 02/04/21  4:38 PM  Result Value Ref Range   Glucose-Capillary 445 (H) 70 - 99 mg/dL    Comment: Glucose reference range applies only to samples taken after fasting for at least 8 hours.  Troponin I (High Sensitivity)      Status: Abnormal   Collection Time: 02/04/21  5:26 PM  Result Value Ref Range   Troponin I (High Sensitivity) 28 (H) <18 ng/L    Comment: (NOTE) Elevated high sensitivity troponin I (hsTnI) values and significant  changes across serial measurements may suggest ACS but many other  chronic and acute conditions are known to elevate hsTnI results.  Refer to the "Links" section for chest pain algorithms and additional  guidance. Performed at [facility], [address] [place], [place] [address]   Osmolality     Status: Abnormal   Collection Time: 02/04/21  5:26 PM  Result Value Ref Range   Osmolality 311 (H) 275 - 295 mOsm/kg    Comment: Performed at [facility], [address] [place], [place] [address]  HIV Antibody (routine testing w rflx)     Status: None   Collection Time: 02/04/21  5:26 PM  Result Value Ref Range   HIV Screen 4th Generation wRfx Non Reactive Non Reactive    Comment: Performed at [facility], [place] [address] [place], [place] [address]  Glucose, capillary     Status: Abnormal   Collection Time: 02/04/21  5:26 PM  Result Value Ref Range   Glucose-Capillary 404 (H) 70 - 99 mg/dL    Comment: Glucose reference range applies only to samples taken after fasting for at least 8 hours.  Basic metabolic panel     Status: Abnormal   Collection Time: 02/04/21  6:11 PM  Result Value Ref Range   Sodium 133 (L) 135 - 145 mmol/L   Potassium 3.0 (L) 3.5 - 5.1 mmol/L   Chloride 98 98 - 111 mmol/L   CO2 27 22 - 32 mmol/L   Glucose, Bld 386 (H) 70 - 99 mg/dL    Comment: Glucose reference range applies only to samples taken after fasting for at least 8 hours.   BUN 27 (H) 6 - 20 mg/dL   Creatinine, Ser 2.76 (H) 0.61 - 1.24 mg/dL   Calcium 8.8 (L) 8.9 - 10.3 mg/dL   GFR, Estimated 28 (L) >60 mL/min    Comment: (NOTE) Calculated using the CKD-EPI  Creatinine Equation (2021)    Anion gap 8 5 - 15    Comment: Performed at [facility],  [place], [place] [address]  Troponin I (High Sensitivity)     Status: Abnormal   Collection Time: 02/04/21  6:11 PM  Result Value Ref Range   Troponin I (High Sensitivity) 27 (H) <18 ng/L    Comment: (NOTE) Elevated high sensitivity troponin I (hsTnI) values and significant  changes across serial measurements may suggest ACS but many other  chronic and acute conditions are known to elevate hsTnI results.  Refer to the "Links" section for chest pain algorithms and additional  guidance. Performed at [facility], [address] [place], [place] [address]   Glucose, capillary     Status: Abnormal   Collection Time: 02/04/21  6:15 PM  Result Value Ref Range   Glucose-Capillary 306 (H) 70 - 99 mg/dL    Comment: Glucose reference range applies only to samples taken after fasting for at least 8 hours.  Glucose, capillary     Status: Abnormal   Collection Time: 02/04/21  7:00 PM  Result Value Ref Range   Glucose-Capillary 311 (H) 70 - 99 mg/dL    Comment: Glucose reference range applies only to samples taken after fasting for at least 8 hours.  Basic metabolic panel     Status: Abnormal   Collection Time: 02/04/21  7:46 PM  Result Value Ref Range   Sodium 135 135 - 145 mmol/L   Potassium 3.2 (L) 3.5 - 5.1 mmol/L   Chloride 100 98 - 111 mmol/L   CO2 24 22 - 32 mmol/L   Glucose, Bld 255 (H) 70 - 99 mg/dL    Comment: Glucose reference range applies only to samples taken after fasting for at least 8 hours.   BUN 25 (H) 6 - 20 mg/dL   Creatinine, Ser 2.58 (H) 0.61 - 1.24 mg/dL   Calcium 8.7 (L) 8.9 - 10.3 mg/dL   GFR, Estimated 30 (L) >60 mL/min    Comment: (NOTE) Calculated using the CKD-EPI Creatinine Equation (2021)    Anion gap 11 5 - 15    Comment: Performed at [facility], [place], [place] [address]  Troponin I (High Sensitivity)     Status: Abnormal   Collection Time: 02/04/21  7:46 PM  Result Value Ref Range   Troponin I  (High Sensitivity) 28 (H) <18 ng/L    Comment: (NOTE) Elevated high sensitivity troponin I (hsTnI) values and significant  changes across serial measurements may suggest ACS but many other  chronic and acute conditions are known to elevate hsTnI results.  Refer to the "Links" section for chest pain algorithms and additional  guidance. Performed at [facility], [address] [place], [place] [address]   Glucose, capillary     Status: Abnormal   Collection Time: 02/04/21  7:55 PM  Result Value Ref Range   Glucose-Capillary 238 (H) 70 - 99 mg/dL    Comment: Glucose reference range applies only to samples taken after fasting for at least 8 hours.  Glucose, capillary     Status: Abnormal   Collection Time: 02/04/21  8:57 PM  Result Value Ref Range   Glucose-Capillary 213 (H) 70 - 99 mg/dL    Comment: Glucose reference range applies only to samples taken after fasting for at least 8 hours.  Urine Drug Screen, Qualitative ([place] only)     Status: None   Collection Time: 02/04/21  9:00 PM  Result Value Ref Range   Tricyclic, Ur Screen NONE DETECTED NONE DETECTED   Amphetamines, Ur Screen NONE DETECTED NONE DETECTED   MDMA (Ecstasy)Ur Screen NONE DETECTED NONE DETECTED   Cocaine Metabolite,Ur [place] NONE DETECTED NONE DETECTED   Opiate, Ur Screen NONE DETECTED NONE DETECTED   Phencyclidine (PCP) Ur S NONE DETECTED NONE DETECTED   Cannabinoid 50 Ng, Ur [place] NONE DETECTED NONE DETECTED   Barbiturates, Ur Screen NONE DETECTED NONE DETECTED   Benzodiazepine, Ur Scrn NONE DETECTED NONE DETECTED   Methadone Scn, Ur NONE DETECTED NONE DETECTED    Comment: (NOTE) Tricyclics + metabolites, urine    Cutoff 1000 ng/mL Amphetamines + metabolites, urine  Cutoff 1000 ng/mL MDMA (Ecstasy), urine              Cutoff 500 ng/mL Cocaine Metabolite, urine          Cutoff 300 ng/mL Opiate + metabolites, urine        Cutoff 300 ng/mL Phencyclidine (PCP), urine         Cutoff 25 ng/mL Cannabinoid,  urine                 Cutoff 50 ng/mL Barbiturates + metabolites, urine  Cutoff 200 ng/mL Benzodiazepine, urine              Cutoff 200 ng/mL Methadone, urine                   Cutoff 300 ng/mL  The urine drug screen provides only a preliminary, unconfirmed analytical test result and should not be used for non-medical purposes. Clinical consideration and professional judgment should be applied to any positive drug screen result due to possible interfering substances. A more specific alternate chemical method must be used in order to obtain a confirmed analytical result. Gas chromatography / mass spectrometry (GC/MS) is the preferred confirm atory method. Performed at [facility], [address] [place], [place] [address]   Glucose, capillary     Status: Abnormal   Collection Time: 02/04/21 10:00 PM  Result Value Ref Range   Glucose-Capillary 204 (H) 70 - 99 mg/dL    Comment: Glucose reference range applies only to samples taken after fasting for at least 8 hours.  Basic metabolic panel     Status: Abnormal   Collection Time: 02/04/21 10:07 PM  Result Value Ref Range   Sodium 135 135 - 145 mmol/L   Potassium 3.1 (L) 3.5 - 5.1 mmol/L   Chloride 100 98 - 111 mmol/L   CO2 27 22 - 32 mmol/L   Glucose, Bld 223 (H) 70 - 99 mg/dL    Comment: Glucose reference range applies only to samples taken after fasting for at least 8 hours.   BUN 25 (H) 6 - 20 mg/dL   Creatinine, Ser 2.52 (H) 0.61 - 1.24 mg/dL   Calcium 8.7 (L) 8.9 - 10.3 mg/dL   GFR, Estimated 31 (L) >60 mL/min    Comment: (NOTE) Calculated using the CKD-EPI Creatinine Equation (2021)    Anion gap 8 5 - 15    Comment: Performed at [facility], [address] [place], [place] [address]  Glucose, capillary     Status: Abnormal   Collection Time: 02/04/21 11:02 PM  Result Value Ref Range   Glucose-Capillary 186 (H) 70 - 99 mg/dL    Comment: Glucose reference range applies only to samples taken after  fasting for at least 8 hours.  Basic metabolic panel  Status: Abnormal   Collection Time: 02/05/21 12:02 AM  Result Value Ref Range   Sodium 135 135 - 145 mmol/L   Potassium 3.2 (L) 3.5 - 5.1 mmol/L   Chloride 101 98 - 111 mmol/L   CO2 26 22 - 32 mmol/L   Glucose, Bld 192 (H) 70 - 99 mg/dL    Comment: Glucose reference range applies only to samples taken after fasting for at least 8 hours.   BUN 25 (H) 6 - 20 mg/dL   Creatinine, Ser 2.41 (H) 0.61 - 1.24 mg/dL   Calcium 8.5 (L) 8.9 - 10.3 mg/dL   GFR, Estimated 33 (L) >60 mL/min    Comment: (NOTE) Calculated using the CKD-EPI Creatinine Equation (2021)    Anion gap 8 5 - 15    Comment: Performed at [facility], [address] [place], [place] [address]  Glucose, capillary     Status: Abnormal   Collection Time: 02/05/21 12:07 AM  Result Value Ref Range   Glucose-Capillary 184 (H) 70 - 99 mg/dL    Comment: Glucose reference range applies only to samples taken after fasting for at least 8 hours.  Glucose, capillary     Status: Abnormal   Collection Time: 02/05/21  1:08 AM  Result Value Ref Range   Glucose-Capillary 178 (H) 70 - 99 mg/dL    Comment: Glucose reference range applies only to samples taken after fasting for at least 8 hours.  Glucose, capillary     Status: Abnormal   Collection Time: 02/05/21  1:44 AM  Result Value Ref Range   Glucose-Capillary 153 (H) 70 - 99 mg/dL    Comment: Glucose reference range applies only to samples taken after fasting for at least 8 hours.  Basic metabolic panel     Status: Abnormal   Collection Time: 02/05/21  2:15 AM  Result Value Ref Range   Sodium 138 135 - 145 mmol/L   Potassium 3.3 (L) 3.5 - 5.1 mmol/L   Chloride 103 98 - 111 mmol/L   CO2 29 22 - 32 mmol/L   Glucose, Bld 170 (H) 70 - 99 mg/dL    Comment: Glucose reference range applies only to samples taken after fasting for at least 8 hours.   BUN 24 (H) 6 - 20 mg/dL   Creatinine, Ser 2.54 (H) 0.61 - 1.24 mg/dL    Calcium 8.4 (L) 8.9 - 10.3 mg/dL   GFR, Estimated 31 (L) >60 mL/min    Comment: (NOTE) Calculated using the CKD-EPI Creatinine Equation (2021)    Anion gap 6 5 - 15    Comment: Performed at [facility], [address] [place], [place] [address]  Lipid panel     Status: Abnormal   Collection Time: 02/05/21  5:52 AM  Result Value Ref Range   Cholesterol 281 (H) 0 - 200 mg/dL   Triglycerides 442 (H) <150 mg/dL   HDL 47 >40 mg/dL   Total CHOL/HDL Ratio 6.0 RATIO   VLDL UNABLE TO CALCULATE IF TRIGLYCERIDE OVER 400 mg/dL 0 - 40 mg/dL   LDL Cholesterol UNABLE TO CALCULATE IF TRIGLYCERIDE OVER 400 mg/dL 0 - 99 mg/dL    Comment:        Total Cholesterol/HDL:CHD Risk Coronary Heart Disease Risk Table                     Men   Women  1/2 Average Risk   3.4   3.3  Average Risk  5.0   4.4  2 X Average Risk   9.6   7.1  3 X Average Risk  23.4   11.0        Use the calculated Patient Ratio above and the CHD Risk Table to determine the patient's CHD Risk.        ATP III CLASSIFICATION (LDL):  <100     mg/dL   Optimal  100-129  mg/dL   Near or Above                    Optimal  130-159  mg/dL   Borderline  160-189  mg/dL   High  >190     mg/dL   Very High Performed at [facility], [address] [place], [place] [id number]   Basic metabolic panel     Status: Abnormal   Collection Time: 02/05/21  5:52 AM  Result Value Ref Range   Sodium 139 135 - 145 mmol/L   Potassium 3.9 3.5 - 5.1 mmol/L   Chloride 103 98 - 111 mmol/L   CO2 29 22 - 32 mmol/L   Glucose, Bld 219 (H) 70 - 99 mg/dL    Comment: Glucose reference range applies only to samples taken after fasting for at least 8 hours.   BUN 23 (H) 6 - 20 mg/dL   Creatinine, Ser 2.59 (H) 0.61 - 1.24 mg/dL   Calcium 8.5 (L) 8.9 - 10.3 mg/dL   GFR, Estimated 30 (L) >60 mL/min    Comment: (NOTE) Calculated using the CKD-EPI Creatinine Equation (2021)    Anion gap 7 5 - 15    Comment: Performed at [facility], [address] [place], [place] [address]  Glucose, capillary     Status: Abnormal   Collection Time: 02/05/21  7:33 AM  Result Value Ref Range   Glucose-Capillary 260 (H) 70 - 99 mg/dL    Comment: Glucose reference range applies only to samples taken after fasting for at least 8 hours.  Basic metabolic panel     Status: Abnormal   Collection Time: 02/05/21 10:58 AM  Result Value Ref Range   Sodium 133 (L) 135 - 145 mmol/L   Potassium 4.3 3.5 - 5.1 mmol/L   Chloride 101 98 - 111 mmol/L   CO2 25 22 - 32 mmol/L   Glucose, Bld 447 (H) 70 - 99 mg/dL    Comment: Glucose reference range applies only to samples taken after fasting for at least 8 hours.   BUN 24 (H) 6 - 20 mg/dL   Creatinine, Ser 2.57 (H) 0.61 - 1.24 mg/dL   Calcium 8.7 (L) 8.9 - 10.3 mg/dL   GFR, Estimated 30 (L) >60 mL/min    Comment: (NOTE) Calculated using the CKD-EPI Creatinine Equation (2021)    Anion gap 7 5 - 15    Comment: Performed at [facility], [address] [place], [place] [address]  Glucose, capillary     Status: Abnormal   Collection Time: 02/05/21 11:17 AM  Result Value Ref Range   Glucose-Capillary 406 (H) 70 - 99 mg/dL    Comment: Glucose reference range applies only to samples taken after fasting for at least 8 hours.    CT HEAD WO CONTRAST (5MM)  Result Date: 02/04/2021 CLINICAL DATA:  Hypertension, headache and dizziness for couple days EXAM: CT HEAD WITHOUT CONTRAST TECHNIQUE: Contiguous axial images were obtained from the base of the skull through the vertex without intravenous contrast. Sagittal and coronal MPR  images reconstructed from axial data set. COMPARISON:  None FINDINGS: Brain: Normal ventricular morphology. No midline shift or mass effect. Normal appearance of brain parenchyma. No intracranial hemorrhage, mass lesion, or evidence of acute infarction. No extra-axial fluid collections. Vascular: No hyperdense vessels. Mild atherosclerotic calcification of  internal carotid arteries at skull base Skull: Intact Sinuses/Orbits: Clear Other: N/A IMPRESSION: No acute intracranial abnormalities. Electronically Signed   By: Lavonia Dana M.D.   On: 02/04/2021 13:27   MR BRAIN WO CONTRAST  Result Date: 02/04/2021 CLINICAL DATA:  Neuro deficit, acute, stroke suspected. Additional history provided: Patient with history of hypertension, diabetes mellitus, CKD, presenting with headache. EXAM: MRI HEAD WITHOUT CONTRAST TECHNIQUE: Multiplanar, multiecho pulse sequences of the brain and surrounding structures were obtained without intravenous contrast. COMPARISON:  Head CT 02/04/2021. FINDINGS: Brain: Cerebral volume is normal for age. No cortical encephalomalacia is identified. Mild multifocal T2 FLAIR hyperintense signal abnormality within the cerebral white matter, nonspecific but compatible with chronic small vessel ischemic disease. There is no acute infarct. No evidence of an intracranial mass. No chronic intracranial blood products. No extra-axial fluid collection. No midline shift. Vascular: Maintained flow voids within the proximal large arterial vessels. Skull and upper cervical spine: No focal suspicious marrow lesion. Sinuses/Orbits: Visualized orbits show no acute finding. Mild mucosal thickening within the right maxillary sinus. IMPRESSION: No evidence of acute intracranial abnormality. Mild chronic small vessel ischemic changes within the cerebral white matter. Mild mucosal thickening within the right maxillary sinus. Electronically Signed   By: Kellie Simmering D.O.   On: 02/04/2021 16:10   US RENAL  Result Date: 02/04/2021 CLINICAL DATA:  Acute kidney injury EXAM: RENAL / URINARY TRACT ULTRASOUND COMPLETE COMPARISON:  None. FINDINGS: Right Kidney: Renal measurements: 12.8 x 7.6 x 5.4 cm = volume: 290.6 mL. Echogenicity within normal limits. No mass or hydronephrosis visualized. Left Kidney: Renal measurements: 12.5 x 6.8 x 6.6 cm = volume: 2092.6 mL. Echogenicity  within normal limits. No hydronephrosis visualized. 11 x 5 x 8 mm echogenic focus in the left kidney the which may reflect a small echogenic mass or dystrophic calcification. Bladder: Appears normal for degree of bladder distention. Other: Increased hepatic echogenicity as can be seen with hepatic steatosis. IMPRESSION: 1. No obstructive uropathy. 2. Increased hepatic echogenicity as can be seen with hepatic steatosis. 3. A 11 x 5 x 8 mm echogenic focus in the left kidney the which may reflect a small echogenic mass such as an angiomyolipoma or dystrophic calcification. Electronically Signed   By: Kathreen Devoid M.D.   On: 02/04/2021 15:42   DG Chest Portable 1 View  Result Date: 02/04/2021 CLINICAL DATA:  Chest pain EXAM: PORTABLE CHEST 1 VIEW COMPARISON:  None. FINDINGS: The mediastinal contour is normal. The heart size is mildly enlarged. Both lungs are clear. The visualized skeletal structures are unremarkable. IMPRESSION: No active disease. Electronically Signed   By: Abelardo Diesel M.D.   On: 02/04/2021 13:43    Blood pressure (!) 188/98, pulse 96, temperature 98.4 F (36.9 C), temperature source Oral, resp. rate 20, height '5\' 3"'$  (1.6 m), weight 100.2 kg, SpO2 98 %.  Mental status: Alert and Oriented x 4  Visual Acuity:  20/50 OD  20/50 near [place] and distance [place]  Pupils:  Equally round/ reactive to light.  No Afferent defect.  Motility:  Full/ orthophoric  Visual Fields:  Full to confrontation OD.  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  There was no hemorrhagic disc edema.  He should follow up in approx 1 month as outpatient. 2) Hyperglycemic transient visual changes - expected with glc fluctuation to 450's 3) Ocular migraines - explains the prismatic effects of left VF 4) Open angle glaucoma suspect - elevated eye pressures, increased c/d ratios of discs, and possible temporal VF defect OS.  Recommend full glaucoma evaluation at 1 month exam at [facility].   David Klein 02/05/2021, 11:34 AM

## 2021-02-06 LAB — BASIC METABOLIC PANEL
Anion gap: 8 (ref 5–15)
BUN: 28 mg/dL — ABNORMAL HIGH (ref 6–20)
CO2: 27 mmol/L (ref 22–32)
Calcium: 9.7 mg/dL (ref 8.9–10.3)
Chloride: 100 mmol/L (ref 98–111)
Creatinine, Ser: 2.9 mg/dL — ABNORMAL HIGH (ref 0.61–1.24)
GFR, Estimated: 26 mL/min — ABNORMAL LOW (ref >60)
Glucose, Bld: 218 mg/dL — ABNORMAL HIGH (ref 70–99)
Potassium: 4 mmol/L (ref 3.5–5.1)
Sodium: 135 mmol/L (ref 135–145)

## 2021-02-06 LAB — PROTEIN / CREATININE RATIO, URINE
Creatinine, Urine: 73 mg/dL
Protein Creatinine Ratio: 6.34 mg/mg{Cre} — ABNORMAL HIGH (ref 0.00–0.15)
Total Protein, Urine: 463 mg/dL

## 2021-02-06 LAB — GLUCOSE, CAPILLARY
Glucose-Capillary: 215 mg/dL — ABNORMAL HIGH (ref 70–99)
Glucose-Capillary: 287 mg/dL — ABNORMAL HIGH (ref 70–99)
Glucose-Capillary: 275 mg/dL — ABNORMAL HIGH (ref 70–99)
Glucose-Capillary: 276 mg/dL — ABNORMAL HIGH (ref 70–99)

## 2021-02-06 LAB — HEMOGLOBIN A1C
Hgb A1c MFr Bld: >15.5 % — ABNORMAL HIGH (ref 4.8–5.6)
Mean Plasma Glucose: >398 mg/dL

## 2021-02-06 MED ORDER — INSULIN GLARGINE-YFGN 100 UNIT/ML ~~LOC~~ SOLN
15.0000 [IU] | Freq: Two times a day (BID) | SUBCUTANEOUS | Status: DC
  Administered 2021-02-06 – 2021-02-07 (×2): 15 [IU] via SUBCUTANEOUS
  Filled 2021-02-06 (×3): qty 0.15

## 2021-02-06 MED ORDER — HYDRALAZINE HCL 50 MG PO TABS
50.0000 mg | ORAL_TABLET | Freq: Three times a day (TID) | ORAL | Status: DC
  Administered 2021-02-06 – 2021-02-07 (×4): 50 mg via ORAL
  Filled 2021-02-06 (×4): qty 1

## 2021-02-06 MED ORDER — TORSEMIDE 20 MG PO TABS
20.0000 mg | ORAL_TABLET | Freq: Every day | ORAL | Status: DC
  Administered 2021-02-06 – 2021-02-07 (×2): 20 mg via ORAL
  Filled 2021-02-06 (×2): qty 1

## 2021-02-06 MED ORDER — LACTATED RINGERS IV SOLN: INTRAVENOUS | Status: AC

## 2021-02-06 NOTE — Progress Notes (Signed)
PROGRESS NOTE    [name]  [id number] DOB: 11/01/1970 DOA: 02/04/2021 PCP: Pcp, No   Brief Narrative: Taken from H&P. David Klein is a 50 y.o. male with medical history significant of hypertension, diabetes mellitus, CKD stage IIIa, who presents with headache. Found to have markedly elevated blood pressure at 263/143 and blood glucose level above 500.  Admitted for hypertensive urgency and HHS.  Patient moved from [place] earlier in the year, used to take HCTZ and amlodipine for blood pressure along with metformin and Lantus for diabetes.  Ran out of his medications for the past couple of month, has not got established with a PCP locally.  He was only taking occasionally metformin as he still has some leftover pills.  CT head and MRI brain was done due to his complaint of some tingling and numbness and left eye vision changes.  They were negative for any acute changes.  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  There was some suspicion of open angle glaucoma with elevated eye pressures.  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  They are recommending outpatient ophthalmology follow-up for further recommendations at [facility] eye care.  Subjective: Patient was seen and examined today.  No new complaints.  He wants to go home but blood pressure remained elevated above [id number] systolic and little worsening of renal function. Agreed to stay 1 more day.  Assessment & Plan:   Principal Problem:   Type 2 diabetes mellitus with hyperosmolar hyperglycemic state (HHS) ([place]) Active Problems:   Hypertensive emergency   Hypertension   Acute renal failure superimposed on stage 3a chronic kidney disease (HCC)   Type II diabetes mellitus with renal manifestations (HCC)   Elevated troponin   Change in vision  Uncontrolled  type 2 diabetes mellitus with HHS and renal manifestations.  A1c done more than a year ago was 7.3.  Patient was only taking metformin, used to take Lantus 25 units at bedtime. Ran out of his meds as he has not got established with PCP yet although moved many months ago. CBGs still elevated after stopping insulin infusion. Repeat A1c still pending. -Increase Semglee to 15 units twice daily -Continue NovoLog 10 units with meals -Continue with resistant SSI. -Planning to discharge with Amaryl, Lantus and Farxiga.  We will stop metformin due to borderline GFR at 30.  Hypertension with hypertension urgency.  Blood pressure still elevated and little worsening of creatinine. -Stop HCTZ -Continue with amlodipine  -Add hydralazine 50 mg every 8 hourly -Patient will get benefit from ACE inhibitor or ARB which can be added once renal function stabilizes as outpatient. -Continue with as needed labetalol and hydralazine.  AKI with CKD stage IIIb.  Baseline creatinine was 1.54 on 01/08/2019, it was above 3 on admission and improved to 2.59 and little worsening at 2.90 this morning.  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  Might be this is his new baseline as he has not seen a physician since that time. -Give him some gentle fluid -Nephrology consult -Monitor renal function -Avoid nephrotoxins  Vision changes.  Most likely secondary to hyperglycemia.  Ophthalmology was consulted and there was some concern of open angle glaucoma with hypertensive and diabetic retinopathy.  They are recommending outpatient follow-up. Vision improved today.  Dyslipidemia.  Elevated total cholesterol, triglycerides and LDL.  ASCVD risk  of 36.2%, which can be improved to 3.3% with risk factor modifications.  Recommending high intensity statin. -Start him on Crestor 40 mg daily.  Elevated troponin.  Mildly elevated troponin most likely secondary to demand  ischemia.  No chest pain.  -Patient was started on low-dose aspirin and statin.  Objective: Vitals:   02/06/21 0924 02/06/21 1000 02/06/21 1107 02/06/21 1109  BP: (!) 166/105 (!) 184/113 (!) 173/100 (!) 162/93  Pulse: (!) 105 (!) 109 (!) 106 (!) 103  Resp:      Temp:      TempSrc:      SpO2:      Weight:      Height:        Intake/Output Summary (Last 24 hours) at 02/06/2021 1453 Last data filed at 02/06/2021 1401 Gross per 24 hour  Intake 480 ml  Output --  Net 480 ml    Filed Weights   02/04/21 1204 02/04/21 1637  Weight: 99.8 kg 100.2 kg    Examination:  General.  Well-developed gentleman, in no acute distress. Pulmonary.  Lungs clear bilaterally, normal respiratory effort. CV.  Regular rate and rhythm, no JVD, rub or murmur. Abdomen.  Soft, nontender, nondistended, BS positive. CNS.  Alert and oriented x3.  No focal neurologic deficit. Extremities.  No edema, no cyanosis, pulses intact and symmetrical. Psychiatry.  Judgment and insight appears normal.    DVT prophylaxis: Lovenox Code Status: Full Family Communication: Discussed with patient Disposition Plan:  Status is: Inpatient  Remains inpatient appropriate because:Inpatient level of care appropriate due to severity of illness  Dispo: The patient is from: Home              Anticipated d/c is to: Home              Patient currently is not medically stable to d/c.   Difficult to place patient No              Level of care: MedSurg  All the records are reviewed and case discussed with Care Management/Social Worker. Management plans discussed with the patient, nursing and they are in agreement.  Consultants:  PCCM Ophthalmology Nephrology  Procedures:  Antimicrobials:   Data Reviewed: I have personally reviewed following labs and imaging studies  CBC: Recent Labs  Lab 02/04/21 1223  WBC 9.5  NEUTROABS 5.7  HGB 12.8*  HCT 35.9*  MCV 86.9  PLT 403*    Basic Metabolic Panel: Recent Labs   Lab 02/05/21 0002 02/05/21 0215 02/05/21 0552 02/05/21 1058 02/06/21 0955  NA 135 138 139 133* 135  K 3.2* 3.3* 3.9 4.3 4.0  CL 101 103 103 101 100  CO2 '26 29 29 25 27  '$ GLUCOSE 192* 170* 219* 447* 218*  BUN 25* 24* 23* 24* 28*  CREATININE 2.41* 2.54* 2.59* 2.57* 2.90*  CALCIUM 8.5* 8.4* 8.5* 8.7* 9.7    GFR: Estimated Creatinine Clearance: 33 mL/min (A) (by C-G formula based on SCr of 2.9 mg/dL (H)). Liver Function Tests: Recent Labs  Lab 02/04/21 1223  AST 26  ALT 20  ALKPHOS 81  BILITOT 0.8  PROT 7.7  ALBUMIN 3.0*    No results for input(s): LIPASE, AMYLASE in the last 168 hours. No results for input(s): AMMONIA in the last 168 hours. Coagulation Profile: No results for input(s): INR, PROTIME in the last 168 hours. Cardiac Enzymes: No results for input(s): CKTOTAL, CKMB, CKMBINDEX, TROPONINI in the last 168 hours. BNP (last 3 results) No results for input(s): PROBNP in  the last 8760 hours. HbA1C: No results for input(s): HGBA1C in the last 72 hours. CBG: Recent Labs  Lab 02/05/21 1419 02/05/21 1821 02/05/21 2114 02/06/21 0738 02/06/21 1118  GLUCAP 440* 188* 218* 215* 287*    Lipid Profile: Recent Labs    02/05/21 0552  CHOL 281*  HDL 47  LDLCALC UNABLE TO CALCULATE IF TRIGLYCERIDE OVER 400 mg/dL  TRIG 442*  CHOLHDL 6.0  LDLDIRECT 152.5*    Thyroid Function Tests: No results for input(s): TSH, T4TOTAL, FREET4, T3FREE, THYROIDAB in the last 72 hours. Anemia Panel: No results for input(s): VITAMINB12, FOLATE, FERRITIN, TIBC, IRON, RETICCTPCT in the last 72 hours. Sepsis Labs: No results for input(s): PROCALCITON, LATICACIDVEN in the last 168 hours.  Recent Results (from the past 240 hour(s))  Resp Panel by RT-PCR (Flu A&B, Covid) Nasopharyngeal Swab     Status: None   Collection Time: 02/04/21  1:32 PM   Specimen: Nasopharyngeal Swab; Nasopharyngeal(NP) swabs in vial transport medium  Result Value Ref Range Status   SARS Coronavirus 2 by RT  PCR NEGATIVE NEGATIVE Final    Comment: (NOTE) SARS-CoV-2 target nucleic acids are NOT DETECTED.  The SARS-CoV-2 RNA is generally detectable in upper respiratory specimens during the acute phase of infection. The lowest concentration of SARS-CoV-2 viral copies this assay can detect is 138 copies/mL. A negative result does not preclude SARS-Cov-2 infection and should not be used as the sole basis for treatment or other patient management decisions. A negative result may occur with  improper specimen collection/handling, submission of specimen other than nasopharyngeal swab, presence of viral mutation(s) within the areas targeted by this assay, and inadequate number of viral copies(<138 copies/mL). A negative result must be combined with clinical observations, patient history, and epidemiological information. The expected result is Negative.  Fact Sheet for Patients:  EntrepreneurPulse.com.au  Fact Sheet for Healthcare Providers:  IncredibleEmployment.be  This test is no t yet approved or cleared by the [place] FDA and  has been authorized for detection and/or diagnosis of SARS-CoV-2 by FDA under an Emergency Use Authorization (EUA). This EUA will remain  in effect (meaning this test can be used) for the duration of the COVID-19 declaration under Section 564(b)(1) of the Act, 21 U.S.C.section 360bbb-3(b)(1), unless the authorization is terminated  or revoked sooner.       Influenza A by PCR NEGATIVE NEGATIVE Final   Influenza B by PCR NEGATIVE NEGATIVE Final    Comment: (NOTE) The Xpert Xpress SARS-CoV-2/FLU/RSV plus assay is intended as an aid in the diagnosis of influenza from Nasopharyngeal swab specimens and should not be used as a sole basis for treatment. Nasal washings and aspirates are unacceptable for Xpert Xpress SARS-CoV-2/FLU/RSV testing.  Fact Sheet for Patients: EntrepreneurPulse.com.au  Fact Sheet for  Healthcare Providers: IncredibleEmployment.be  This test is not yet approved or cleared by the [place] FDA and has been authorized for detection and/or diagnosis of SARS-CoV-2 by FDA under an Emergency Use Authorization (EUA). This EUA will remain in effect (meaning this test can be used) for the duration of the COVID-19 declaration under Section 564(b)(1) of the Act, 21 U.S.C. section 360bbb-3(b)(1), unless the authorization is terminated or revoked.  Performed at [facility], [place], [address] [place], [place] [address]   MRSA Next Gen by PCR, Nasal     Status: None   Collection Time: 02/04/21  4:38 PM   Specimen: Nasal Mucosa; Nasal Swab  Result Value Ref Range Status   MRSA by PCR Next Gen NOT  DETECTED NOT DETECTED Final    Comment: (NOTE) The GeneXpert MRSA Assay (FDA approved for NASAL specimens only), is one component of a comprehensive MRSA colonization surveillance program. It is not intended to diagnose MRSA infection nor to guide or monitor treatment for MRSA infections. Test performance is not FDA approved in patients less than 52 years old. Performed at [facility], [address], [place] [address]       Radiology Studies: MR BRAIN WO CONTRAST  Result Date: 02/04/2021 CLINICAL DATA:  Neuro deficit, acute, stroke suspected. Additional history provided: Patient with history of hypertension, diabetes mellitus, CKD, presenting with headache. EXAM: MRI HEAD WITHOUT CONTRAST TECHNIQUE: Multiplanar, multiecho pulse sequences of the brain and surrounding structures were obtained without intravenous contrast. COMPARISON:  Head CT 02/04/2021. FINDINGS: Brain: Cerebral volume is normal for age. No cortical encephalomalacia is identified. Mild multifocal T2 FLAIR hyperintense signal abnormality within the cerebral white matter, nonspecific but compatible with chronic small vessel ischemic disease. There is no acute infarct. No  evidence of an intracranial mass. No chronic intracranial blood products. No extra-axial fluid collection. No midline shift. Vascular: Maintained flow voids within the proximal large arterial vessels. Skull and upper cervical spine: No focal suspicious marrow lesion. Sinuses/Orbits: Visualized orbits show no acute finding. Mild mucosal thickening within the right maxillary sinus. IMPRESSION: No evidence of acute intracranial abnormality. Mild chronic small vessel ischemic changes within the cerebral white matter. Mild mucosal thickening within the right maxillary sinus. Electronically Signed   By: Kellie Simmering D.O.   On: 02/04/2021 16:10   US RENAL  Result Date: 02/04/2021 CLINICAL DATA:  Acute kidney injury EXAM: RENAL / URINARY TRACT ULTRASOUND COMPLETE COMPARISON:  None. FINDINGS: Right Kidney: Renal measurements: 12.8 x 7.6 x 5.4 cm = volume: 290.6 mL. Echogenicity within normal limits. No mass or hydronephrosis visualized. Left Kidney: Renal measurements: 12.5 x 6.8 x 6.6 cm = volume: 2092.6 mL. Echogenicity within normal limits. No hydronephrosis visualized. 11 x 5 x 8 mm echogenic focus in the left kidney the which may reflect a small echogenic mass or dystrophic calcification. Bladder: Appears normal for degree of bladder distention. Other: Increased hepatic echogenicity as can be seen with hepatic steatosis. IMPRESSION: 1. No obstructive uropathy. 2. Increased hepatic echogenicity as can be seen with hepatic steatosis. 3. A 11 x 5 x 8 mm echogenic focus in the left kidney the which may reflect a small echogenic mass such as an angiomyolipoma or dystrophic calcification. Electronically Signed   By: Kathreen Devoid M.D.   On: 02/04/2021 15:42    Scheduled Meds:  amLODipine  10 mg Oral Daily   aspirin EC  81 mg Oral Daily   hydrALAZINE  50 mg Oral Q8H   insulin aspart  0-20 Units Subcutaneous TID WC   insulin aspart  0-5 Units Subcutaneous QHS   insulin aspart  10 Units Subcutaneous TID WC   insulin  glargine-yfgn  15 Units Subcutaneous BID   rosuvastatin  40 mg Oral Daily   torsemide  20 mg Oral Daily   Continuous Infusions:  lactated ringers 100 mL/hr at 02/06/21 1252     LOS: 2 days   Time spent: 35 minutes. More than 50% of the time was spent in counseling/coordination of care  Lorella Nimrod, MD [facility]  If 7PM-7AM, please contact night-coverage [email or web address]  02/06/2021, 2:53 PM   This record has been created using Systems analyst. Errors have been sought and corrected,but may not always be located. Such  creation errors do not reflect on the standard of care.

## 2021-02-06 NOTE — Progress Notes (Signed)
Inpatient Diabetes Program Recommendations  AACE/ADA: New Consensus Statement on Inpatient Glycemic Control (2015)  Target Ranges:  Prepandial:   less than 140 mg/dL      Peak postprandial:   less than 180 mg/dL (1-2 hours)      Critically ill patients:  140 - 180 mg/dL   Lab Results  Component Value Date   GLUCAP 287 (H) 02/06/2021    Review of Glycemic Control Results for JAXSUN, ESPANA (MRN [id number]) as of 02/06/2021 13:13  Ref. Range 02/05/2021 14:19 02/05/2021 18:21 02/05/2021 21:14 02/06/2021 07:38 02/06/2021 11:18  Glucose-Capillary Latest Ref Range: 70 - 99 mg/dL 440 (H) 188 (H) 218 (H) 215 (H) 287 (H)   Diabetes history: DM 2 Outpatient Diabetes medications:  Lantus/metformin-patient was not taking due to running out Current orders for Inpatient glycemic control:  Novolog resistant tid with meals and HS Semglee 15 units bid Novolog 10 units tid with meals  Inpatient Diabetes Program Recommendations:    Spoke to patient at bedside.  He admits to running out of medications at home.  He is interested in CGM and asked about possibly getting one to help with monitoring prior to discharge.  Will discuss with MD and if she is okay with it, we can provide sample for Freestyle CGM at discharge.   Agree with increase in basal insulin.  May be able to be switched to daily once discharge dose determined.  Will follow.   Thanks,  Adah Perl, RN, BC-ADM Inpatient Diabetes Coordinator Pager [phone]  (8a-5p)

## 2021-02-06 NOTE — Progress Notes (Signed)
BP 199/115. Administering morning medications, which include BP medications, and will re-check in one hour.  Attending MD informed.

## 2021-02-06 NOTE — Care Management (Signed)
Patient did not have preference on new PCP, but agreeable to appointment Appointment made at [facility].  Appointment information added to AVS

## 2021-02-06 NOTE — Consult Note (Signed)
[facility] Consult Note: 02/06/2021    Date of Admission:  02/04/2021           Reason for Consult: CKD4, HTN    Referring Provider: Lorella Nimrod, MD Primary Care Provider: Pcp, No   History of Presenting Illness:  David Klein is a 50 y.o. male with medical problems of longstanding diabetes, with retinopathy, hypertension, sleep apnea He presented to the emergency room on October 8 for hypertension urgency.  He complained of headache and dizziness upon presentation.  States that he had been out of medication for about a month. Patient was evaluated by ophthalmology on October 9 for blurred vision.  He had blood pressure of 263/140 with hyperglycemia.  He was diagnosed with ocular migraine and suspected open-angle glaucoma.  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 Blood pressure today is still elevated.  This morning 162/93 He does have some lower extremity edema He states that he does not have his CPAP.    Review of Systems: ROS  Past Medical History:  Diagnosis Date   Diabetes ([place])    Hypertension     Social History   Tobacco Use   Smoking status: Never   Smokeless tobacco: Never  Substance Use Topics   Alcohol use: Not Currently    Comment: occasionally   Drug use: Never    Family History  Problem Relation Age of Onset   Hypertension Mother    Hypertension Father      OBJECTIVE: Blood pressure (!) 162/93, pulse (!) 103, temperature 98 F (36.7 C), temperature source Oral, resp. rate 16, height '5\' 3"'$  (1.6 m), weight 100.2 kg, SpO2 99 %.  Physical Exam Physical Exam: General:  No acute distress, laying in the bed  HEENT  anicteric, moist oral mucous membrane  Pulm/lungs  normal breathing effort, lungs are clear to auscultation  CVS/Heart  regular rhythm, no rub or gallop  Abdomen:   Soft, nontender  Extremities:  ++ peripheral edema, left  greater than right  Neurologic:  Alert, oriented, able to follow commands  Skin:  No acute rashes     Lab Results Lab Results  Component Value Date   WBC 9.5 02/04/2021   HGB 12.8 (L) 02/04/2021   HCT 35.9 (L) 02/04/2021   MCV 86.9 02/04/2021   PLT 403 (H) 02/04/2021    Lab Results  Component Value Date   CREATININE 2.90 (H) 02/06/2021   BUN 28 (H) 02/06/2021   NA 135 02/06/2021   K 4.0 02/06/2021   CL 100 02/06/2021   CO2 27 02/06/2021    Lab Results  Component Value Date   ALT 20 02/04/2021   AST 26 02/04/2021   ALKPHOS 81 02/04/2021   BILITOT 0.8 02/04/2021     Microbiology: Recent Results (from the past 240 hour(s))  Resp Panel by RT-PCR (Flu A&B, Covid) Nasopharyngeal Swab     Status: None   Collection Time: 02/04/21  1:32 PM   Specimen: Nasopharyngeal Swab; Nasopharyngeal(NP) swabs in vial transport medium  Result Value Ref Range Status   SARS Coronavirus 2 by RT PCR NEGATIVE NEGATIVE Final    Comment: (NOTE) SARS-CoV-2 target nucleic acids are NOT DETECTED.  The SARS-CoV-2 RNA is generally detectable in upper respiratory specimens during the acute phase of infection. The lowest concentration of SARS-CoV-2 viral copies this assay can detect is 138 copies/mL. A negative result does not preclude SARS-Cov-2 infection and should not be used as the sole basis  for treatment or other patient management decisions. A negative result may occur with  improper specimen collection/handling, submission of specimen other than nasopharyngeal swab, presence of viral mutation(s) within the areas targeted by this assay, and inadequate number of viral copies(<138 copies/mL). A negative result must be combined with clinical observations, patient history, and epidemiological information. The expected result is Negative.  Fact Sheet for Patients:  EntrepreneurPulse.com.au  Fact Sheet for Healthcare Providers:   IncredibleEmployment.be  This test is no t yet approved or cleared by the [place] FDA and  has been authorized for detection and/or diagnosis of SARS-CoV-2 by FDA under an Emergency Use Authorization (EUA). This EUA will remain  in effect (meaning this test can be used) for the duration of the COVID-19 declaration under Section 564(b)(1) of the Act, 21 U.S.C.section 360bbb-3(b)(1), unless the authorization is terminated  or revoked sooner.       Influenza A by PCR NEGATIVE NEGATIVE Final   Influenza B by PCR NEGATIVE NEGATIVE Final    Comment: (NOTE) The Xpert Xpress SARS-CoV-2/FLU/RSV plus assay is intended as an aid in the diagnosis of influenza from Nasopharyngeal swab specimens and should not be used as a sole basis for treatment. Nasal washings and aspirates are unacceptable for Xpert Xpress SARS-CoV-2/FLU/RSV testing.  Fact Sheet for Patients: EntrepreneurPulse.com.au  Fact Sheet for Healthcare Providers: IncredibleEmployment.be  This test is not yet approved or cleared by the [place] FDA and has been authorized for detection and/or diagnosis of SARS-CoV-2 by FDA under an Emergency Use Authorization (EUA). This EUA will remain in effect (meaning this test can be used) for the duration of the COVID-19 declaration under Section 564(b)(1) of the Act, 21 U.S.C. section 360bbb-3(b)(1), unless the authorization is terminated or revoked.  Performed at [facility], [address] [place], [place] [address]   MRSA Next Gen by PCR, Nasal     Status: None   Collection Time: 02/04/21  4:38 PM   Specimen: Nasal Mucosa; Nasal Swab  Result Value Ref Range Status   MRSA by PCR Next Gen NOT DETECTED NOT DETECTED Final    Comment: (NOTE) The GeneXpert MRSA Assay (FDA approved for NASAL specimens only), is one component of a comprehensive MRSA colonization surveillance program. It is not intended to  diagnose MRSA infection nor to guide or monitor treatment for MRSA infections. Test performance is not FDA approved in patients less than 23 years old. Performed at [facility], [address] [place], [place] [address]     Medications: Scheduled Meds:  amLODipine  10 mg Oral Daily   aspirin EC  81 mg Oral Daily   hydrALAZINE  50 mg Oral Q8H   insulin aspart  0-20 Units Subcutaneous TID WC   insulin aspart  0-5 Units Subcutaneous QHS   insulin aspart  10 Units Subcutaneous TID WC   insulin glargine-yfgn  15 Units Subcutaneous BID   rosuvastatin  40 mg Oral Daily   torsemide  20 mg Oral Daily   Continuous Infusions:  lactated ringers 100 mL/hr at 02/06/21 1252   PRN Meds:.acetaminophen, dextrose, diphenhydrAMINE, hydrALAZINE, nitroGLYCERIN  No Known Allergies  Urinalysis: No results for input(s): COLORURINE, LABSPEC, PHURINE, GLUCOSEU, HGBUR, BILIRUBINUR, KETONESUR, PROTEINUR, UROBILINOGEN, NITRITE, LEUKOCYTESUR in the last 72 hours.  Invalid input(s): APPERANCEUR    Imaging: MR BRAIN WO CONTRAST  Result Date: 02/04/2021 CLINICAL DATA:  Neuro deficit, acute, stroke suspected. Additional history provided: Patient with history of hypertension, diabetes mellitus, CKD, presenting with headache. EXAM: MRI HEAD WITHOUT  CONTRAST TECHNIQUE: Multiplanar, multiecho pulse sequences of the brain and surrounding structures were obtained without intravenous contrast. COMPARISON:  Head CT 02/04/2021. FINDINGS: Brain: Cerebral volume is normal for age. No cortical encephalomalacia is identified. Mild multifocal T2 FLAIR hyperintense signal abnormality within the cerebral white matter, nonspecific but compatible with chronic small vessel ischemic disease. There is no acute infarct. No evidence of an intracranial mass. No chronic intracranial blood products. No extra-axial fluid collection. No midline shift. Vascular: Maintained flow voids within the proximal large arterial vessels.  Skull and upper cervical spine: No focal suspicious marrow lesion. Sinuses/Orbits: Visualized orbits show no acute finding. Mild mucosal thickening within the right maxillary sinus. IMPRESSION: No evidence of acute intracranial abnormality. Mild chronic small vessel ischemic changes within the cerebral white matter. Mild mucosal thickening within the right maxillary sinus. Electronically Signed   By: Kellie Simmering D.O.   On: 02/04/2021 16:10   US RENAL  Result Date: 02/04/2021 CLINICAL DATA:  Acute kidney injury EXAM: RENAL / URINARY TRACT ULTRASOUND COMPLETE COMPARISON:  None. FINDINGS: Right Kidney: Renal measurements: 12.8 x 7.6 x 5.4 cm = volume: 290.6 mL. Echogenicity within normal limits. No mass or hydronephrosis visualized. Left Kidney: Renal measurements: 12.5 x 6.8 x 6.6 cm = volume: 2092.6 mL. Echogenicity within normal limits. No hydronephrosis visualized. 11 x 5 x 8 mm echogenic focus in the left kidney the which may reflect a small echogenic mass or dystrophic calcification. Bladder: Appears normal for degree of bladder distention. Other: Increased hepatic echogenicity as can be seen with hepatic steatosis. IMPRESSION: 1. No obstructive uropathy. 2. Increased hepatic echogenicity as can be seen with hepatic steatosis. 3. A 11 x 5 x 8 mm echogenic focus in the left kidney the which may reflect a small echogenic mass such as an angiomyolipoma or dystrophic calcification. Electronically Signed   By: Kathreen Devoid M.D.   On: 02/04/2021 15:42      Assessment/Plan:  David Klein is a 50 y.o. male with medical problems of diabetes with diabetic retinopathy, nephropathy, hypertension, obstructive sleep apnea, obesity, hyperlipidemia  was admitted on 02/04/2021 for :  AKI (acute kidney injury) ([place]) [N17.9] Hypertensive emergency [I16.1] Type 2 diabetes mellitus with hyperosmolar hyperglycemic state (HHS) ([place]) [E11.00]  #Acute kidney injury with chronic kidney disease stage IIIb Previous  known creatinine of 1.54, GFR 54 from September 2020 No other recent lab results available for review.  History of some nonsteroidal use in the past.  No IV contrast exposure. More recent baseline creatinine appears to be 2.41/GFR 33 from February 05, 2021 Some fluctuation in creatinine is noted which is likely secondary to uncontrolled blood pressure and diabetes.  We will continue to follow while patient is admitted as well as as outpatient.  #Diabetes type 2 with CKD Diagnosed with hypertensive and diabetic moderate proliferative retinopathy Patient also has hyperlipidemia.   Agree with aspirin and statin for cardiovascular risk reduction Will consider starting ACE inhibitor as outpatient Consider SGLT2 inhibitor as outpatient -Obtain urine protein to creatinine ratio -Last known hemoglobin A1c 7.3% from July 2021 -Urine albumin to creatinine ratio 1.9 g from January 2021  #Hypertension with lower extremity edema Currently on amlodipine, hydralazine We will add torsemide for volume control     Luan Maberry 02/06/21

## 2021-02-07 LAB — BASIC METABOLIC PANEL
Anion gap: 10 (ref 5–15)
BUN: 36 mg/dL — ABNORMAL HIGH (ref 6–20)
CO2: 23 mmol/L (ref 22–32)
Calcium: 9.2 mg/dL (ref 8.9–10.3)
Chloride: 100 mmol/L (ref 98–111)
Creatinine, Ser: 2.85 mg/dL — ABNORMAL HIGH (ref 0.61–1.24)
GFR, Estimated: 27 mL/min — ABNORMAL LOW (ref >60)
Glucose, Bld: 123 mg/dL — ABNORMAL HIGH (ref 70–99)
Potassium: 3.4 mmol/L — ABNORMAL LOW (ref 3.5–5.1)
Sodium: 133 mmol/L — ABNORMAL LOW (ref 135–145)

## 2021-02-07 LAB — GLUCOSE, CAPILLARY
Glucose-Capillary: 162 mg/dL — ABNORMAL HIGH (ref 70–99)
Glucose-Capillary: 238 mg/dL — ABNORMAL HIGH (ref 70–99)

## 2021-02-07 MED ORDER — LOSARTAN POTASSIUM 25 MG PO TABS: 25.0000 mg | ORAL_TABLET | Freq: Every day | ORAL | 1 refills | Status: DC

## 2021-02-07 MED ORDER — LOSARTAN POTASSIUM 25 MG PO TABS
25.0000 mg | ORAL_TABLET | Freq: Every day | ORAL | Status: DC
  Administered 2021-02-07: 25 mg via ORAL
  Filled 2021-02-07: qty 1

## 2021-02-07 MED ORDER — ROSUVASTATIN CALCIUM 40 MG PO TABS: 40.0000 mg | ORAL_TABLET | Freq: Every day | ORAL | 1 refills | Status: DC

## 2021-02-07 MED ORDER — INSULIN GLARGINE 100 UNIT/ML SOLOSTAR PEN: 15.0000 [IU] | PEN_INJECTOR | Freq: Two times a day (BID) | SUBCUTANEOUS | 11 refills | Status: DC

## 2021-02-07 MED ORDER — HYDRALAZINE HCL 50 MG PO TABS: 50.0000 mg | ORAL_TABLET | Freq: Three times a day (TID) | ORAL | 1 refills | Status: DC

## 2021-02-07 MED ORDER — TORSEMIDE 20 MG PO TABS: 20.0000 mg | ORAL_TABLET | Freq: Every day | ORAL | 1 refills | Status: DC

## 2021-02-07 NOTE — TOC CM/SW Note (Signed)
Patient has orders to discharge home today. Chart reviewed. On room air. No wounds. No further concerns. CSW signing off.  Dayton Scrape, [place]

## 2021-02-07 NOTE — Progress Notes (Signed)
Patient is being discharged.  Spoke with him by phone and he plans to f/u with PCP and ask for prescription for CGM-sensor.  He states he has no further questions at this time.   Thanks  Adah Perl, RN, BC-ADM Inpatient Diabetes Coordinator Pager [phone]

## 2021-02-07 NOTE — Progress Notes (Signed)
[facility]  ROUNDING NOTE   Subjective:   David Klein is a 50 y.o. male with medical problems of longstanding diabetes, with retinopathy, hypertension, sleep apnea. He is admitted to [facility] with AKI (acute kidney injury) ([place]) [N17.9] Hypertensive emergency [I16.1] Type 2 diabetes mellitus with hyperosmolar hyperglycemic state (HHS) ([place]) [E11.00]  Patient seen sitting up in recliner.  Tolerating meals, denies nausea and vomiting.  Denies shortness of breath States he feels much better today and swelling of legs has improved   Objective:  Vital signs in last 24 hours:  Temp:  [98 F (36.7 C)-98.6 F (37 C)] 98.6 F (37 C) (10/11 0742) Pulse Rate:  [87-109] 98 (10/11 0742) Resp:  [17-20] 17 (10/11 0742) BP: (144-185)/(87-111) 164/87 (10/11 0742) SpO2:  [94 %-100 %] 94 % (10/11 0742)  Weight change:  Filed Weights   02/04/21 1204 02/04/21 1637  Weight: 99.8 kg 100.2 kg    Intake/Output: I/O last 3 completed shifts: In: [id number] [P.O.:720; I.V.:2626] Out: 1250 [Urine:1250]   Intake/Output this shift:  Total I/O In: 480 [P.O.:480] Out: 300 [Urine:300]  Physical Exam: General: NAD, sitting in chair  Head: Normocephalic, atraumatic. Moist oral mucosal membranes  Eyes: Anicteric  Lungs:  Clear to auscultation  Heart: Regular rate and rhythm  Abdomen:  Soft, nontender  Extremities:  + peripheral edema.  Neurologic: Nonfocal, moving all four extremities  Skin: No lesions or rashes       Basic Metabolic Panel: Recent Labs  Lab 02/05/21 0215 02/05/21 0552 02/05/21 1058 02/06/21 0955 02/07/21 0453  NA 138 139 133* 135 133*  K 3.3* 3.9 4.3 4.0 3.4*  CL 103 103 101 100 100  CO2 '29 29 25 27 23  '$ GLUCOSE 170* 219* 447* 218* 123*  BUN 24* 23* 24* 28* 36*  CREATININE 2.54* 2.59* 2.57* 2.90* 2.85*  CALCIUM 8.4* 8.5* 8.7* 9.7 9.2    Liver Function Tests: Recent Labs  Lab 02/04/21 1223  AST 26  ALT 20  ALKPHOS 81  BILITOT 0.8  PROT 7.7  ALBUMIN  3.0*   No results for input(s): LIPASE, AMYLASE in the last 168 hours. No results for input(s): AMMONIA in the last 168 hours.  CBC: Recent Labs  Lab 02/04/21 1223  WBC 9.5  NEUTROABS 5.7  HGB 12.8*  HCT 35.9*  MCV 86.9  PLT 403*    Cardiac Enzymes: No results for input(s): CKTOTAL, CKMB, CKMBINDEX, TROPONINI in the last 168 hours.  BNP: Invalid input(s): POCBNP  CBG: Recent Labs  Lab 02/06/21 1118 02/06/21 1634 02/06/21 2051 02/07/21 0742 02/07/21 1126  GLUCAP 287* 275* 276* 162* 238*    Microbiology: Results for orders placed or performed during the hospital encounter of 02/04/21  Resp Panel by RT-PCR (Flu A&B, Covid) Nasopharyngeal Swab     Status: None   Collection Time: 02/04/21  1:32 PM   Specimen: Nasopharyngeal Swab; Nasopharyngeal(NP) swabs in vial transport medium  Result Value Ref Range Status   SARS Coronavirus 2 by RT PCR NEGATIVE NEGATIVE Final    Comment: (NOTE) SARS-CoV-2 target nucleic acids are NOT DETECTED.  The SARS-CoV-2 RNA is generally detectable in upper respiratory specimens during the acute phase of infection. The lowest concentration of SARS-CoV-2 viral copies this assay can detect is 138 copies/mL. A negative result does not preclude SARS-Cov-2 infection and should not be used as the sole basis for treatment or other patient management decisions. A negative result may occur with  improper specimen collection/handling, submission of specimen other than nasopharyngeal swab,  presence of viral mutation(s) within the areas targeted by this assay, and inadequate number of viral copies(<138 copies/mL). A negative result must be combined with clinical observations, patient history, and epidemiological information. The expected result is Negative.  Fact Sheet for Patients:  EntrepreneurPulse.com.au  Fact Sheet for Healthcare Providers:  IncredibleEmployment.be  This test is no t yet approved or  cleared by the [place] FDA and  has been authorized for detection and/or diagnosis of SARS-CoV-2 by FDA under an Emergency Use Authorization (EUA). This EUA will remain  in effect (meaning this test can be used) for the duration of the COVID-19 declaration under Section 564(b)(1) of the Act, 21 U.S.C.section 360bbb-3(b)(1), unless the authorization is terminated  or revoked sooner.       Influenza A by PCR NEGATIVE NEGATIVE Final   Influenza B by PCR NEGATIVE NEGATIVE Final    Comment: (NOTE) The Xpert Xpress SARS-CoV-2/FLU/RSV plus assay is intended as an aid in the diagnosis of influenza from Nasopharyngeal swab specimens and should not be used as a sole basis for treatment. Nasal washings and aspirates are unacceptable for Xpert Xpress SARS-CoV-2/FLU/RSV testing.  Fact Sheet for Patients: EntrepreneurPulse.com.au  Fact Sheet for Healthcare Providers: IncredibleEmployment.be  This test is not yet approved or cleared by the [place] FDA and has been authorized for detection and/or diagnosis of SARS-CoV-2 by FDA under an Emergency Use Authorization (EUA). This EUA will remain in effect (meaning this test can be used) for the duration of the COVID-19 declaration under Section 564(b)(1) of the Act, 21 U.S.C. section 360bbb-3(b)(1), unless the authorization is terminated or revoked.  Performed at [facility], [address] [place], [place] [address]   MRSA Next Gen by PCR, Nasal     Status: None   Collection Time: 02/04/21  4:38 PM   Specimen: Nasal Mucosa; Nasal Swab  Result Value Ref Range Status   MRSA by PCR Next Gen NOT DETECTED NOT DETECTED Final    Comment: (NOTE) The GeneXpert MRSA Assay (FDA approved for NASAL specimens only), is one component of a comprehensive MRSA colonization surveillance program. It is not intended to diagnose MRSA infection nor to guide or monitor treatment for MRSA infections. Test  performance is not FDA approved in patients less than 82 years old. Performed at [facility], [address] [place], [place] [address]     Coagulation Studies: No results for input(s): LABPROT, INR in the last 72 hours.  Urinalysis: No results for input(s): COLORURINE, LABSPEC, PHURINE, GLUCOSEU, HGBUR, BILIRUBINUR, KETONESUR, PROTEINUR, UROBILINOGEN, NITRITE, LEUKOCYTESUR in the last 72 hours.  Invalid input(s): APPERANCEUR    Imaging: No results found.   Medications:     amLODipine  10 mg Oral Daily   aspirin EC  81 mg Oral Daily   hydrALAZINE  50 mg Oral Q8H   insulin aspart  0-20 Units Subcutaneous TID WC   insulin aspart  0-5 Units Subcutaneous QHS   insulin aspart  10 Units Subcutaneous TID WC   insulin glargine-yfgn  15 Units Subcutaneous BID   losartan  25 mg Oral Daily   rosuvastatin  40 mg Oral Daily   torsemide  20 mg Oral Daily   acetaminophen, dextrose, diphenhydrAMINE, hydrALAZINE, nitroGLYCERIN  Assessment/ Plan:  [name] is a 50 y.o.  male with medical problems of longstanding diabetes, with retinopathy, hypertension, sleep apnea. Patient was admitted for AKI (acute kidney injury) ([place]) [N17.9] Hypertensive emergency [I16.1] Type 2 diabetes mellitus with hyperosmolar hyperglycemic state (HHS) ([place]) [  E11.00]   Acute Kidney Injury on chronic kidney disease stage IIIB with baseline creatinine 1.54 and GFR of 54 on Sept 2020.  More recent baseline creatinine appears to be 2.41/GFR 33 from February 05, 2021.  No IV contrast exposure. Some fluctuation in creatinine is noted which is likely secondary to uncontrolled blood pressure and diabetes.  Creatinine stable at this time.  Discussed dietary and lifestyle changes with patient to preserve remaining renal function.  We will schedule outpatient follow-up once discharged with our office.  Lab Results  Component Value Date   CREATININE 2.85 (H) 02/07/2021   CREATININE 2.90 (H) 02/06/2021    CREATININE 2.57 (H) 02/05/2021    Intake/Output Summary (Last 24 hours) at 02/07/2021 1448 Last data filed at 02/07/2021 1027 Gross per 24 hour  Intake 2346 ml  Output 1550 ml  Net 796 ml   2. Diabetes type 2 with CKD Diagnosed with hypertensive and diabetic moderate proliferative retinopathy Patient also has hyperlipidemia.   Agree with aspirin and statin for cardiovascular risk reduction Will consider starting ACE inhibitor and SGLT2 inhibitor as outpatient -Urine protein creatinine ratio elevated 6.3 -Last known hemoglobin A1c 7.3% from July 2021 -Urine albumin to creatinine ratio 1.9 g from January 2021  3. Hypertension with lower extremity edema Currently on amlodipine, hydralazine Torsemide started for volume control      LOS: 3 Milynn Quirion 10/11/20222:48 PM

## 2021-02-09 LAB — HEMOGLOBIN A1C
Hgb A1c MFr Bld: >15.5 % — ABNORMAL HIGH (ref 4.8–5.6)
Mean Plasma Glucose: >398 mg/dL

## 2021-03-08 ENCOUNTER — Ambulatory Visit (INDEPENDENT_AMBULATORY_CARE_PROVIDER_SITE_OTHER): Payer: 59 | Admitting: Family Medicine

## 2021-03-08 ENCOUNTER — Encounter: Payer: Self-pay | Admitting: Family Medicine

## 2021-03-08 ENCOUNTER — Other Ambulatory Visit: Payer: Self-pay

## 2021-03-08 VITALS — BP 174/86 | HR 102 | Temp 98.2°F | Ht 63.0 in | Wt 236.0 lb

## 2021-03-08 DIAGNOSIS — G4733 Obstructive sleep apnea (adult) (pediatric): Secondary | ICD-10-CM | POA: Diagnosis not present

## 2021-03-08 DIAGNOSIS — N1831 Chronic kidney disease, stage 3a: Secondary | ICD-10-CM

## 2021-03-08 DIAGNOSIS — N179 Acute kidney failure, unspecified: Secondary | ICD-10-CM | POA: Diagnosis not present

## 2021-03-08 DIAGNOSIS — I10 Essential (primary) hypertension: Secondary | ICD-10-CM | POA: Diagnosis not present

## 2021-03-08 DIAGNOSIS — E1121 Type 2 diabetes mellitus with diabetic nephropathy: Secondary | ICD-10-CM | POA: Diagnosis not present

## 2021-03-08 MED ORDER — LOSARTAN POTASSIUM 50 MG PO TABS: 25.0000 mg | ORAL_TABLET | Freq: Every day | ORAL | 2 refills | Status: DC

## 2021-03-08 NOTE — Progress Notes (Signed)
What is this [place] dropping assessment and that we need to do    Primary Care / Sports Medicine Office Visit  Patient Information:  Patient ID: David Klein, male DOB: [birth year] Age: 50 y.o. MRN: [id number]   David Klein is a pleasant 50 y.o. male presenting with the following:  Chief Complaint  Patient presents with   New Patient (Initial Visit)   Establish Care    Admission at [facility] 02/04/21 for AKI and discharged 02/07/21   Diabetes    Taking medications as prescribed and checking blood sugars twice daily   Hypertension    Taking prescribed medications and check BP at home    Review of Systems pertinent details above   Patient Active Problem List   Diagnosis Date Noted   Hypertensive emergency 02/04/2021   Hypertension    Acute renal failure superimposed on stage 3a chronic kidney disease ([place])    Type II diabetes mellitus with renal manifestations ([place])    Hyponatremia    Type 2 diabetes mellitus with hyperosmolar hyperglycemic state (HHS) ([place])    Elevated troponin    Change in vision    Past Medical History:  Diagnosis Date   Allergy    Diabetes ([place])    Hyperlipidemia    Hypertension    Outpatient Encounter Medications as of 03/08/2021  Medication Sig   amLODipine (NORVASC) 10 MG tablet Take 1 tablet (10 mg total) by mouth daily.   aspirin 81 MG EC tablet Take 1 tablet (81 mg total) by mouth daily.   blood glucose meter kit and supplies KIT Dispense based on patient and insurance preference. Use up to four times daily as directed. (Patient taking differently: Inject 1 each into the skin in the morning and at bedtime. Use up to four times daily as directed.)   dapagliflozin propanediol (FARXIGA) 10 MG TABS tablet Take 1 tablet (10 mg total) by mouth daily before breakfast.   diphenhydrAMINE HCl (BENADRYL ALLERGY PO) Take 1 tablet by mouth in the morning and at bedtime.   glimepiride (AMARYL) 2 MG tablet Take 1 tablet (2 mg total) by mouth every  morning.   hydrALAZINE (APRESOLINE) 50 MG tablet Take 1 tablet (50 mg total) by mouth every 8 (eight) hours.   insulin glargine (LANTUS) 100 UNIT/ML Solostar Pen Inject 15 Units into the skin 2 (two) times daily.   Insulin Pen Needle (PEN NEEDLES) 31G X 5 MM MISC 15 Units by Does not apply route at bedtime.   rosuvastatin (CRESTOR) 40 MG tablet Take 1 tablet (40 mg total) by mouth daily.   torsemide (DEMADEX) 20 MG tablet Take 1 tablet (20 mg total) by mouth daily.   [DISCONTINUED] losartan (COZAAR) 25 MG tablet Take 1 tablet (25 mg total) by mouth daily.   losartan (COZAAR) 50 MG tablet Take 0.5 tablets (25 mg total) by mouth daily.   No facility-administered encounter medications on file as of 03/08/2021.   Past Surgical History:  Procedure Laterality Date   WISDOM TOOTH EXTRACTION Bilateral     Vitals:   03/08/21 1023  BP: (!) 174/86  Pulse: (!) 102  Temp: 98.2 F (36.8 C)  SpO2: 98%   Vitals:   03/08/21 1023  Weight: 236 lb (107 kg)  Height: $Remove'5\' 3"'[place]$  (1.6 m)   Body mass index is 41.81 kg/m.  No results found.   Independent interpretation of notes and tests performed by another provider:   None  Procedures performed:   None  Pertinent History, Exam, Impression,  and Recommendations:   Hypertension Recent discharge from hospital, persistently elevated (no improved) readings noted.  He is asymptomatic from a cardiopulmonary standpoint.  Physical examination reveals positive S1 and S2, regular rate and rhythm, no additional heart sounds.  He has clear lung fields throughout without wheezes, rales, rhonchi.  Given his comorbid medical conditions and significance of BP elevation, I have advised further evaluation by cardiology.  I have also placed a referral to ENT/sleep medicine given his stated history of OSA that is untreated.  Over the interim we will titrate his losartan to 50 mg, he is to continue remaining medications, and contact us for any refills needed.  We will  coordinate a close follow-up in 2 months time.  Acute renal failure superimposed on stage 3a chronic kidney disease ([place]) Noted during recent hospital admission in the setting of uncontrolled hypertension and diabetes mellitus type 2, does have upcoming visit with nephrology scheduled, we will follow peripherally on this issue.  A recheck BMP was ordered.  Type II diabetes mellitus with renal manifestations [facility]) Patient with significant elevations noted on recent A1c, medication management during inpatient stay.  Given his persistently elevated blood sugars I have advised increase of Lantus to 20 units twice daily, monitoring for hypoglycemia advised, information for medication management coordinator for continuous blood glucose monitor provided.  A referral to endocrinology was placed as well for further management.   I provided a total time of 61 minutes including both face-to-face and non-face-to-face time on 03/08/2021 inclusive of time utilized for medical chart review, recent hospital stay, lab review, information gathering, care coordination with staff, and documentation completion.   Orders & Medications Meds ordered this encounter  Medications   losartan (COZAAR) 50 MG tablet    Sig: Take 0.5 tablets (25 mg total) by mouth daily.    Dispense:  30 tablet    Refill:  2   Orders Placed This Encounter  Procedures   Ambulatory referral to Endocrinology   Ambulatory referral to ENT   Ambulatory referral to Cardiology     Return in about 2 months (around 05/08/2021).     Montel Culver, MD   Primary Care Sports Medicine [place]

## 2021-03-08 NOTE — Assessment & Plan Note (Addendum)
Recent discharge from hospital, persistently elevated (no improved) readings noted.  He is asymptomatic from a cardiopulmonary standpoint.  Physical examination reveals positive S1 and S2, regular rate and rhythm, no additional heart sounds.  He has clear lung fields throughout without wheezes, rales, rhonchi.  Given his comorbid medical conditions and significance of BP elevation, I have advised further evaluation by cardiology.  I have also placed a referral to ENT/sleep medicine given his stated history of OSA that is untreated.  Over the interim we will titrate his losartan to 50 mg, he is to continue remaining medications, and contact us for any refills needed.  We will coordinate a close follow-up in 2 months time.

## 2021-03-08 NOTE — Assessment & Plan Note (Signed)
Patient with significant elevations noted on recent A1c, medication management during inpatient stay.  Given his persistently elevated blood sugars I have advised increase of Lantus to 20 units twice daily, monitoring for hypoglycemia advised, information for medication management coordinator for continuous blood glucose monitor provided.  A referral to endocrinology was placed as well for further management.

## 2021-03-08 NOTE — Assessment & Plan Note (Signed)
Noted during recent hospital admission in the setting of uncontrolled hypertension and diabetes mellitus type 2, does have upcoming visit with nephrology scheduled, we will follow peripherally on this issue.  A recheck BMP was ordered.

## 2021-03-08 NOTE — Patient Instructions (Addendum)
-   Continue current medications - Increase losartan to 50 mg daily (can use 2 tablets until you pick up new Rx) - Increase Lantus to 20 units twice daily - Maintain regularly scheduled meals - For any symptoms of low blood sugar, as discussed, check blood glucose level, have [place] back if below 100 - Follow-up with nephrology, cardiology, endocrinology, and ENT/sleep medicine - Return for follow-up in 2 months - Contact for any questions between now and then  Medication Management for Continuous Glucose Monitor- [place]: [phone]

## 2021-03-13 ENCOUNTER — Encounter: Payer: Self-pay | Admitting: Family Medicine

## 2021-03-13 ENCOUNTER — Telehealth: Payer: Self-pay

## 2021-03-13 DIAGNOSIS — N1831 Chronic kidney disease, stage 3a: Secondary | ICD-10-CM

## 2021-03-13 DIAGNOSIS — E1100 Type 2 diabetes mellitus with hyperosmolarity without nonketotic hyperglycemic-hyperosmolar coma (NKHHC): Secondary | ICD-10-CM

## 2021-03-13 DIAGNOSIS — I10 Essential (primary) hypertension: Secondary | ICD-10-CM

## 2021-03-13 NOTE — Telephone Encounter (Signed)
Labs ordered.  Patient notified and verbalized understanding.  Would prefer labs be mailed to him and he will obtain them at [facility]  For your information.

## 2021-03-13 NOTE — Telephone Encounter (Signed)
-----   Message from Montel Culver, MD sent at 03/08/2021 12:35 PM EST ----- Regarding: BMP/CBC Patient needs BMP/CBC ordered, he can get done anytime this week, does not need to be fasting

## 2021-03-20 DIAGNOSIS — E1100 Type 2 diabetes mellitus with hyperosmolarity without nonketotic hyperglycemic-hyperosmolar coma (NKHHC): Secondary | ICD-10-CM | POA: Diagnosis not present

## 2021-03-20 DIAGNOSIS — I10 Essential (primary) hypertension: Secondary | ICD-10-CM | POA: Diagnosis not present

## 2021-03-21 LAB — CBC WITH DIFFERENTIAL/PLATELET
Basophils Absolute: 0 10*3/uL (ref 0.0–0.2)
Basos: 1 %
EOS (ABSOLUTE): 0.2 10*3/uL (ref 0.0–0.4)
Eos: 3 %
Hematocrit: 32.4 % — ABNORMAL LOW (ref 37.5–51.0)
Hemoglobin: 10.8 g/dL — ABNORMAL LOW (ref 13.0–17.7)
Immature Grans (Abs): 0 10*3/uL (ref 0.0–0.1)
Immature Granulocytes: 1 %
Lymphocytes Absolute: 2.1 10*3/uL (ref 0.7–3.1)
Lymphs: 33 %
MCH: 29.1 pg (ref 26.6–33.0)
MCHC: 33.3 g/dL (ref 31.5–35.7)
MCV: 87 fL (ref 79–97)
Monocytes Absolute: 0.7 10*3/uL (ref 0.1–0.9)
Monocytes: 11 %
Neutrophils Absolute: 3.4 10*3/uL (ref 1.4–7.0)
Neutrophils: 51 %
Platelets: 367 10*3/uL (ref 150–450)
RBC: 3.71 x10E6/uL — ABNORMAL LOW (ref 4.14–5.80)
RDW: 12.8 % (ref 11.6–15.4)
WBC: 6.4 10*3/uL (ref 3.4–10.8)

## 2021-03-21 LAB — BASIC METABOLIC PANEL
BUN/Creatinine Ratio: 8 — ABNORMAL LOW (ref 9–20)
BUN: 25 mg/dL — ABNORMAL HIGH (ref 6–24)
CO2: 22 mmol/L (ref 20–29)
Calcium: 9.5 mg/dL (ref 8.7–10.2)
Chloride: 102 mmol/L (ref 96–106)
Creatinine, Ser: 3.08 mg/dL — ABNORMAL HIGH (ref 0.76–1.27)
Glucose: 126 mg/dL — ABNORMAL HIGH (ref 70–99)
Potassium: 4.8 mmol/L (ref 3.5–5.2)
Sodium: 138 mmol/L (ref 134–144)
eGFR: 24 mL/min/{1.73_m2} — ABNORMAL LOW (ref >59)

## 2021-04-11 NOTE — Progress Notes (Signed)
New Outpatient Visit Date: 04/12/2021  Referring Provider: Jerrol Banana, MD [address] [place],  [place] [address]  Chief Complaint: Elevated blood pressure  HPI:  David Klein is a 50 y.o. male who is being seen today for the evaluation of elevated blood pressure at the request of Jerrol Banana, MD. He has a history of hypertension, type 2 diabetes mellitus, and chronic kidney disease. He was hospitalized in October with hypertensive emergency  and hyperosmolar hyperglycemic state complicated by acute kidney injury superimposed on CKD.  He was subsequently seen by Dr. Ashley Royalty to establish with a primary care provider.  At that time, blood pressure was poorly controlled at 174/86, prompting escalation of losartan as well as referral for sleep study.  Today, David Klein reports that he feels fairly well.  He notes intermittent shortness of breath that he describes primarily as congestion in his face and upper chest.  He wonders if this could be allergies.  This has been chronic.  He also has longstanding leg swelling that he attributes to his medications.  He reports urinating quite frequently with torsemide.  He denies orthopnea and PND as well as chest pain.  He has occasional palpitations during which it feels like his heart races for 30-60 seconds.  There are no associated symptoms.  He reports having put on 4-5 pounds over the last month.  He was scheduled to follow-up with Dr. Thedore Mins in the nephrology clinic but had to cancel his visit and is waiting to reschedule a new appointment.  --------------------------------------------------------------------------------------------------  Past Medical History:  Diagnosis Date   Allergy    Chronic kidney disease    Diabetes (HCC)    Hyperlipidemia    Hypertension     Past Surgical History:  Procedure Laterality Date   WISDOM TOOTH EXTRACTION Bilateral     Current Meds  Medication Sig   amLODipine (NORVASC) 10 MG  tablet Take 1 tablet (10 mg total) by mouth daily.   aspirin 81 MG EC tablet Take 1 tablet (81 mg total) by mouth daily.   blood glucose meter kit and supplies KIT Dispense based on patient and insurance preference. Use up to four times daily as directed.   carvedilol (COREG) 6.25 MG tablet Take 1 tablet (6.25 mg total) by mouth 2 (two) times daily.   dapagliflozin propanediol (FARXIGA) 10 MG TABS tablet Take 1 tablet (10 mg total) by mouth daily before breakfast.   diphenhydrAMINE HCl (BENADRYL ALLERGY PO) Take by mouth 2 (two) times daily as needed.   glimepiride (AMARYL) 2 MG tablet Take 1 tablet (2 mg total) by mouth every morning.   hydrALAZINE (APRESOLINE) 50 MG tablet Take 1 tablet (50 mg total) by mouth every 8 (eight) hours.   insulin glargine (LANTUS) 100 UNIT/ML injection Inject 20 Units into the skin daily.   Insulin Pen Needle (PEN NEEDLES) 31G X 5 MM MISC 15 Units by Does not apply route at bedtime.   losartan (COZAAR) 50 MG tablet Take 0.5 tablets (25 mg total) by mouth daily.   rosuvastatin (CRESTOR) 40 MG tablet Take 1 tablet (40 mg total) by mouth daily.   torsemide (DEMADEX) 20 MG tablet Take 1 tablet (20 mg total) by mouth daily.    Allergies: Patient has no known allergies.  Social History   Tobacco Use   Smoking status: Some Days    Types: Cigars   Smokeless tobacco: Never   Tobacco comments:    1 cigar every few months.  Vaping  Use   Vaping Use: Never used  Substance Use Topics   Alcohol use: Yes    Comment: Few drinks every few months.   Drug use: Never    Family History  Problem Relation Age of Onset   Hypertension Mother    Hypertension Father    Lung cancer Father    Hypertension Sister    Hypertension Sister    Hypertension Brother    Stroke Paternal Grandmother     Review of Systems: A 12-system review of systems was performed and was negative except as noted in the  HPI.  --------------------------------------------------------------------------------------------------  Physical Exam: BP (!) 170/78 (BP Location: Right Arm, Patient Position: Sitting, Cuff Size: Large)    Pulse (!) 101    Ht $R'5\' 3"'rX$  (1.6 m)    Wt 241 lb (109.3 kg)    SpO2 98%    BMI 42.69 kg/m   General: NAD. HEENT: No conjunctival pallor or scleral icterus. Facemask in place. Neck: Supple without lymphadenopathy, thyromegaly, JVD, or HJR, though body habitus limits evaluation unable to assess PMI due to body habitus.. No carotid bruit. Lungs: Normal work of breathing. Clear to auscultation bilaterally without wheezes or crackles. Heart: Tachycardic but regular without murmurs, rubs, or gallops. Non-displaced PMI. Abd: Bowel sounds present. Soft, NT/ND.  Unable to assess HSM due to body habitus. Ext: 1+ pitting pretibial edema in both legs. Radial, PT, and DP pulses are 2+ bilaterally Skin: Warm and dry without rash. Neuro: CNIII-XII intact. Strength and fine-touch sensation intact in upper and lower extremities bilaterally. Psych: Normal mood and affect.  EKG: Sinus tachycardia (heart rate 101 bpm).  Otherwise, no significant abnormality.  Lab Results  Component Value Date   WBC 6.4 03/20/2021   HGB 10.8 (L) 03/20/2021   HCT 32.4 (L) 03/20/2021   MCV 87 03/20/2021   PLT 367 03/20/2021    Lab Results  Component Value Date   NA 138 03/20/2021   K 4.8 03/20/2021   CL 102 03/20/2021   CO2 22 03/20/2021   BUN 25 (H) 03/20/2021   CREATININE 3.08 (H) 03/20/2021   GLUCOSE 126 (H) 03/20/2021   ALT 20 02/04/2021    Lab Results  Component Value Date   CHOL 281 (H) 02/05/2021   HDL 47 02/05/2021   LDLCALC UNABLE TO CALCULATE IF TRIGLYCERIDE OVER 400 mg/dL 02/05/2021   LDLDIRECT 152.5 (H) 02/05/2021   TRIG 442 (H) 02/05/2021   CHOLHDL 6.0 02/05/2021     --------------------------------------------------------------------------------------------------  ASSESSMENT AND  PLAN: Shortness of breath and leg swelling: These findings are likely multifactorial.  Amlodipine could certainly be contributing to leg swelling.  David Klein is at high risk for heart failure (both systolic and diastolic) in the setting of his uncontrolled hypertension as well as diabetes mellitus.  EKG today is notable only for mild sinus tachycardia.  I recommend that we obtain an echocardiogram.  In the meantime, David Klein should continue his current medications including torsemide.  We will also add carvedilol 6.25 mg twice daily to help with blood pressure and heart rate control.  Uncontrolled hypertension: Blood pressure a little better today than at recent visit with Dr. Zigmund Daniel, though it still remains quite elevated.  Given tachycardia and palpitations, I think addition of a beta-blocker would be helpful for blood pressure and heart rate control.  We will add carvedilol 6.  2 5 mg twice daily.  I will defer changes to his other antihypertensive regimens, though I worry about the long-term use of losartan in the  setting of David Klein worsening renal function.  We will obtain a renal artery Doppler to exclude renal artery stenosis underlying has resistant hypertension and worsening kidney failure.  Sodium restriction was encouraged.  Acute kidney injury superimposed on chronic kidney disease: David Klein has a history of moderate CKD with acute kidney injury noted during hospitalization in October.  Creatinine had actually trended up slightly on the last check in November following escalation of losartan by Dr. Zigmund Daniel.  I encouraged David Klein to schedule a follow-up visit with nephrology.  I will defer medication changes today though risks and benefits of continued losartan and dapagliflozin use will need to be weighed by Drs. Zigmund Daniel and same.  Type 2 diabetes mellitus: Continue current medications with ongoing management per Dr. Zigmund Daniel.  As above, discontinuation of dapagliflozin in  the setting of advanced renal insufficiency may need to be considered in the future.  Morbid obesity: BMI greater than 40 with multiple comorbidities.  I agree with referral for sleep study.  Weight loss through diet and exercise were encouraged.  Follow-up: Return to clinic in 1 month.  Nelva Bush, MD 04/12/2021 1:15 PM

## 2021-04-12 ENCOUNTER — Telehealth: Payer: Self-pay

## 2021-04-12 ENCOUNTER — Encounter: Payer: Self-pay | Admitting: Internal Medicine

## 2021-04-12 ENCOUNTER — Other Ambulatory Visit: Payer: Self-pay

## 2021-04-12 ENCOUNTER — Ambulatory Visit (INDEPENDENT_AMBULATORY_CARE_PROVIDER_SITE_OTHER): Payer: 59 | Admitting: Internal Medicine

## 2021-04-12 VITALS — BP 170/78 | HR 101 | Ht 63.0 in | Wt 241.0 lb

## 2021-04-12 DIAGNOSIS — M7989 Other specified soft tissue disorders: Secondary | ICD-10-CM

## 2021-04-12 DIAGNOSIS — E1122 Type 2 diabetes mellitus with diabetic chronic kidney disease: Secondary | ICD-10-CM | POA: Diagnosis not present

## 2021-04-12 DIAGNOSIS — I10 Essential (primary) hypertension: Secondary | ICD-10-CM | POA: Diagnosis not present

## 2021-04-12 DIAGNOSIS — R0602 Shortness of breath: Secondary | ICD-10-CM | POA: Diagnosis not present

## 2021-04-12 DIAGNOSIS — N189 Chronic kidney disease, unspecified: Secondary | ICD-10-CM

## 2021-04-12 DIAGNOSIS — Z794 Long term (current) use of insulin: Secondary | ICD-10-CM | POA: Diagnosis not present

## 2021-04-12 DIAGNOSIS — N179 Acute kidney failure, unspecified: Secondary | ICD-10-CM

## 2021-04-12 DIAGNOSIS — E1100 Type 2 diabetes mellitus with hyperosmolarity without nonketotic hyperglycemic-hyperosmolar coma (NKHHC): Secondary | ICD-10-CM

## 2021-04-12 MED ORDER — CARVEDILOL 6.25 MG PO TABS: 6.2500 mg | ORAL_TABLET | Freq: Two times a day (BID) | ORAL | 2 refills | Status: DC

## 2021-04-12 NOTE — Telephone Encounter (Signed)
Copied from [place] [id number]. Topic: Referral - Question >> Apr 12, 2021  1:56 PM Oneta Rack wrote: Jeani Hawking from [place], [place] office requesting additional office notes, already received 1 office notes but needs one more. Please fax patient demographics as well to [phone].

## 2021-04-12 NOTE — Progress Notes (Signed)
Ref placed for endo

## 2021-04-12 NOTE — Patient Instructions (Signed)
Medication Instructions:   Your physician has recommended you make the following change in your medication:   START Carvedilol 6.25 mg TWICE daily   *If you need a refill on your cardiac medications before your next appointment, please call your pharmacy*   Lab Work:  None ordered  Testing/Procedures:  1) Your physician has requested that you have an echocardiogram. Echocardiography is a painless test that uses sound waves to create images of your heart. It provides your doctor with information about the size and shape of your heart and how well your hearts chambers and valves are working. This procedure takes approximately one hour. There are no restrictions for this procedure.  2) Your physician has requested that you have a renal artery duplex. During this test, an ultrasound is used to evaluate blood flow to the kidneys. Allow one hour for this exam. Do not eat after midnight the day before and avoid carbonated beverages. Take your medications as you usually do.    Follow-Up: At [facility], you and your health needs are our priority.  As part of our continuing mission to provide you with exceptional heart care, we have created designated Provider Care Teams.  These Care Teams include your primary Cardiologist (physician) and Advanced Practice Providers (APPs -  Physician Assistants and Nurse Practitioners) who all work together to provide you with the care you need, when you need it.  We recommend signing up for the patient portal called "MyChart".  Sign up information is provided on this After Visit Summary.  MyChart is used to connect with patients for Virtual Visits (Telemedicine).  Patients are able to view lab/test results, encounter notes, upcoming appointments, etc.  Non-urgent messages can be sent to your provider as well.   To learn more about what you can do with MyChart, go to [email or web address].    Your next appointment:   1 month(s)  The format for your next  appointment:   In Person  Provider:   You may see Dr. Harrell Gave End or one of the following Advanced Practice Providers on your designated Care Team:   Murray Hodgkins, NP Christell Faith, PA-C Cadence Kathlen Mody, [place]   Other Instructions  DASH Eating Plan DASH stands for Dietary Approaches to Stop Hypertension. The DASH eating plan is a healthy eating plan that has been shown to: Reduce high blood pressure (hypertension). Reduce your risk for type 2 diabetes, heart disease, and stroke. Help with weight loss. What are tips for following this plan? Reading food labels Check food labels for the amount of salt (sodium) per serving. Choose foods with less than 5 percent of the Daily Value of sodium. Generally, foods with less than 300 milligrams (mg) of sodium per serving fit into this eating plan. To find whole grains, look for the word "whole" as the first word in the ingredient list. Shopping Buy products labeled as "low-sodium" or "no salt added." Buy fresh foods. Avoid canned foods and pre-made or frozen meals. Cooking Avoid adding salt when cooking. Use salt-free seasonings or herbs instead of table salt or sea salt. Check with your health care provider or pharmacist before using salt substitutes. Do not fry foods. Cook foods using healthy methods such as baking, boiling, grilling, roasting, and broiling instead. Cook with heart-healthy oils, such as olive, canola, avocado, soybean, or sunflower oil. Meal planning  Eat a balanced diet that includes: 4 or more servings of fruits and 4 or more servings of vegetables each day. Try to fill one-half of your plate  with fruits and vegetables. 6-8 servings of whole grains each day. Less than 6 oz (170 g) of lean meat, poultry, or fish each day. A 3-oz (85-g) serving of meat is about the same size as a deck of cards. One egg equals 1 oz (28 g). 2-3 servings of low-fat dairy each day. One serving is 1 cup (237 mL). 1 serving of nuts, seeds, or  beans 5 times each week. 2-3 servings of heart-healthy fats. Healthy fats called omega-3 fatty acids are found in foods such as walnuts, flaxseeds, fortified milks, and eggs. These fats are also found in cold-water fish, such as sardines, salmon, and mackerel. Limit how much you eat of: Canned or prepackaged foods. Food that is high in trans fat, such as some fried foods. Food that is high in saturated fat, such as fatty meat. Desserts and other sweets, sugary drinks, and other foods with added sugar. Full-fat dairy products. Do not salt foods before eating. Do not eat more than 4 egg yolks a week. Try to eat at least 2 vegetarian meals a week. Eat more home-cooked food and less restaurant, buffet, and fast food. Lifestyle When eating at a restaurant, ask that your food be prepared with less salt or no salt, if possible. If you drink alcohol: Limit how much you use to: 0-1 drink a day for women who are not pregnant. 0-2 drinks a day for men. Be aware of how much alcohol is in your drink. In the [place] one drink equals one 12 oz bottle of beer (355 mL), one 5 oz glass of wine (148 mL), or one 1 oz glass of hard liquor (44 mL). General information Avoid eating more than 2,300 mg of salt a day. If you have hypertension, you may need to reduce your sodium intake to 1,500 mg a day. Work with your health care provider to maintain a healthy body weight or to lose weight. Ask what an ideal weight is for you. Get at least 30 minutes of exercise that causes your heart to beat faster (aerobic exercise) most days of the week. Activities may include walking, swimming, or biking. Work with your health care provider or dietitian to adjust your eating plan to your individual calorie needs. What foods should I eat? Fruits All fresh, dried, or frozen fruit. Canned fruit in natural juice (without added sugar). Vegetables Fresh or frozen vegetables (raw, steamed, roasted, or grilled). Low-sodium or  reduced-sodium tomato and vegetable juice. Low-sodium or reduced-sodium tomato sauce and tomato paste. Low-sodium or reduced-sodium canned vegetables. Grains Whole-grain or whole-wheat bread. Whole-grain or whole-wheat pasta. Brown rice. [place]. Bulgur. Whole-grain and low-sodium cereals. Pita bread. Low-fat, low-sodium crackers. Whole-wheat flour tortillas. Meats and other proteins Skinless chicken or [place]. Ground chicken or [place]. Pork with fat trimmed off. Fish and seafood. Egg whites. Dried beans, peas, or lentils. Unsalted nuts, nut butters, and seeds. Unsalted canned beans. Lean cuts of beef with fat trimmed off. Low-sodium, lean precooked or cured meat, such as sausages or meat loaves. Dairy Low-fat (1%) or fat-free (skim) milk. Reduced-fat, low-fat, or fat-free cheeses. Nonfat, low-sodium ricotta or cottage cheese. Low-fat or nonfat yogurt. Low-fat, low-sodium cheese. Fats and oils Soft margarine without trans fats. Vegetable oil. Reduced-fat, low-fat, or light mayonnaise and salad dressings (reduced-sodium). Canola, safflower, olive, avocado, soybean, and sunflower oils. Avocado. Seasonings and condiments Herbs. Spices. Seasoning mixes without salt. Other foods Unsalted popcorn and pretzels. Fat-free sweets. The items listed above may not be a complete list of foods and beverages  you can eat. Contact a dietitian for more information. What foods should I avoid? Fruits Canned fruit in a light or heavy syrup. Fried fruit. Fruit in cream or butter sauce. Vegetables Creamed or fried vegetables. Vegetables in a cheese sauce. Regular canned vegetables (not low-sodium or reduced-sodium). Regular canned tomato sauce and paste (not low-sodium or reduced-sodium). Regular tomato and vegetable juice (not low-sodium or reduced-sodium). Angie Fava. Olives. Grains Baked goods made with fat, such as croissants, muffins, or some breads. Dry pasta or rice meal packs. Meats and other  proteins Fatty cuts of meat. Ribs. Fried meat. Berniece Salines. Bologna, salami, and other precooked or cured meats, such as sausages or meat loaves. Fat from the back of a pig (fatback). Bratwurst. Salted nuts and seeds. Canned beans with added salt. Canned or smoked fish. Whole eggs or egg yolks. Chicken or [place] with skin. Dairy Whole or 2% milk, cream, and half-and-half. Whole or full-fat cream cheese. Whole-fat or sweetened yogurt. Full-fat cheese. Nondairy creamers. Whipped toppings. Processed cheese and cheese spreads. Fats and oils Butter. Stick margarine. Lard. Shortening. Ghee. Bacon fat. Tropical oils, such as coconut, palm kernel, or palm oil. Seasonings and condiments Onion salt, garlic salt, seasoned salt, table salt, and sea salt. Worcestershire sauce. Tartar sauce. Barbecue sauce. Teriyaki sauce. Soy sauce, including reduced-sodium. Steak sauce. Canned and packaged gravies. Fish sauce. Oyster sauce. Cocktail sauce. Store-bought horseradish. Ketchup. Mustard. Meat flavorings and tenderizers. Bouillon cubes. Hot sauces. Pre-made or packaged marinades. Pre-made or packaged taco seasonings. Relishes. Regular salad dressings. Other foods Salted popcorn and pretzels. The items listed above may not be a complete list of foods and beverages you should avoid. Contact a dietitian for more information. Where to find more information National Heart, Lung, and Blood Institute: https://wilson-eaton.com/ American Heart Association: www.heart.org Academy of Nutrition and Dietetics: www.eatright.[place]: www.kidney.org Summary The DASH eating plan is a healthy eating plan that has been shown to reduce high blood pressure (hypertension). It may also reduce your risk for type 2 diabetes, heart disease, and stroke. When on the DASH eating plan, aim to eat more fresh fruits and vegetables, whole grains, lean proteins, low-fat dairy, and heart-healthy fats. With the DASH eating plan, you should  limit salt (sodium) intake to 2,300 mg a day. If you have hypertension, you may need to reduce your sodium intake to 1,500 mg a day. Work with your health care provider or dietitian to adjust your eating plan to your individual calorie needs. This information is not intended to replace advice given to you by your health care provider. Make sure you discuss any questions you have with your health care provider. Document Revised: 03/20/2019 Document Reviewed: 03/20/2019 Elsevier Patient Education  2022 [employer].

## 2021-04-25 DIAGNOSIS — J3489 Other specified disorders of nose and nasal sinuses: Secondary | ICD-10-CM | POA: Diagnosis not present

## 2021-04-25 DIAGNOSIS — G4733 Obstructive sleep apnea (adult) (pediatric): Secondary | ICD-10-CM | POA: Diagnosis not present

## 2021-04-28 ENCOUNTER — Other Ambulatory Visit: Payer: Self-pay | Admitting: Internal Medicine

## 2021-04-28 DIAGNOSIS — I10 Essential (primary) hypertension: Secondary | ICD-10-CM

## 2021-05-02 DIAGNOSIS — G4730 Sleep apnea, unspecified: Secondary | ICD-10-CM | POA: Diagnosis not present

## 2021-05-08 ENCOUNTER — Ambulatory Visit: Payer: 59 | Admitting: Family Medicine

## 2021-05-15 ENCOUNTER — Other Ambulatory Visit: Payer: Self-pay

## 2021-05-15 ENCOUNTER — Encounter: Payer: Self-pay | Admitting: Family Medicine

## 2021-05-15 ENCOUNTER — Ambulatory Visit: Payer: 59 | Admitting: Family Medicine

## 2021-05-15 VITALS — BP 160/96 | HR 78 | Ht 63.0 in | Wt 238.0 lb

## 2021-05-15 DIAGNOSIS — E1122 Type 2 diabetes mellitus with diabetic chronic kidney disease: Secondary | ICD-10-CM | POA: Diagnosis not present

## 2021-05-15 DIAGNOSIS — I10 Essential (primary) hypertension: Secondary | ICD-10-CM

## 2021-05-15 DIAGNOSIS — E1100 Type 2 diabetes mellitus with hyperosmolarity without nonketotic hyperglycemic-hyperosmolar coma (NKHHC): Secondary | ICD-10-CM

## 2021-05-15 DIAGNOSIS — N184 Chronic kidney disease, stage 4 (severe): Secondary | ICD-10-CM

## 2021-05-15 DIAGNOSIS — Z794 Long term (current) use of insulin: Secondary | ICD-10-CM

## 2021-05-15 LAB — POCT GLYCOSYLATED HEMOGLOBIN (HGB A1C): Hemoglobin A1C: 8.2 % — AB (ref 4.0–5.6)

## 2021-05-15 MED ORDER — INSULIN GLARGINE 100 UNIT/ML ~~LOC~~ SOLN: 20.0000 [IU] | Freq: Two times a day (BID) | SUBCUTANEOUS | 2 refills | Status: DC

## 2021-05-15 MED ORDER — LOSARTAN POTASSIUM 25 MG PO TABS: 25.0000 mg | ORAL_TABLET | Freq: Every day | ORAL | 0 refills | Status: DC

## 2021-05-15 NOTE — Patient Instructions (Addendum)
-   Discontinue Farxiga - Take new dose of losartan 25 mg - Continue Lantus 20 Units twice daily - Schedule visit with nephrology and maintain follow-up with cardiology and endocrinology - Return in 3 months

## 2021-05-15 NOTE — Progress Notes (Signed)
°  ° °  Primary Care / Sports Medicine Office Visit  Patient Information:  Patient ID: David Klein, male DOB: 10/28/1970 Age: 51 y.o. MRN: [id number]   David Klein is a pleasant 51 y.o. male presenting with the following:  Chief Complaint  Patient presents with   Hypertension   Diabetes    Vitals:   05/15/21 1016  BP: (!) 160/96  Pulse: 78  SpO2: 99%   Vitals:   05/15/21 1016  Weight: 238 lb (108 kg)  Height: 5\' 3"  (1.6 m)   Body mass index is 42.16 kg/m.  No results found.   Independent interpretation of notes and tests performed by another provider:   None  Procedures performed:   None  Pertinent History, Exam, Impression, and Recommendations:   Type 2 diabetes mellitus with chronic kidney disease, with long-term current use of insulin (HCC) Excellent interval A1c noted from >15.5 (02/06/2021) to 8.2 today. He has made healthy lifestyle changes and has an upcoming endocrinology visit scheduled for early February. I have advised patient to discontinue Wilder Glade, otherwise maintain current regimen. He may benefit from GLP-1 agonist to accommodate SGLT-2 discontinuation, additionally wean from insulin. Will defer to endocrinology to make associated medication changes and I have encouraged continued healthy lifestyle changes given his A1c reduction.  Hypertension Continued elevation noted, has upcoming echocardiogram scheduled and follow-up with cardiology. Decrease in ARB per cardiology recommendation employed with new Rx provided. He did complete sleep study and is awaiting results and next steps.  Morbid obesity ([place]) Has made improvement in his diet, focusing on plant based nutrition. May benefit from GLP-1 agonist if indicated. Will review at return in 3 months.   Orders & Medications Meds ordered this encounter  Medications   insulin glargine (LANTUS) 100 UNIT/ML injection    Sig: Inject 0.2 mLs (20 Units total) into the skin 2 (two) times daily.     Dispense:  10 mL    Refill:  2   losartan (COZAAR) 25 MG tablet    Sig: Take 1 tablet (25 mg total) by mouth daily.    Dispense:  90 tablet    Refill:  0   Orders Placed This Encounter  Procedures   POCT HgB A1C     Return in about 3 months (around 08/13/2021).     Montel Culver, MD   Primary Care Sports Medicine [place]

## 2021-05-15 NOTE — Assessment & Plan Note (Addendum)
Has made improvement in his diet, focusing on plant based nutrition. May benefit from GLP-1 agonist if indicated. Will review at return in 3 months.

## 2021-05-15 NOTE — Assessment & Plan Note (Addendum)
Excellent interval A1c noted from >15.5 (02/06/2021) to 8.2 today. He has made healthy lifestyle changes and has an upcoming endocrinology visit scheduled for early February. I have advised patient to discontinue Wilder Glade, otherwise maintain current regimen. He may benefit from GLP-1 agonist to accommodate SGLT-2 discontinuation, additionally wean from insulin. Will defer to endocrinology to make associated medication changes and I have encouraged continued healthy lifestyle changes given his A1c reduction.

## 2021-05-15 NOTE — Assessment & Plan Note (Signed)
Continued elevation noted, has upcoming echocardiogram scheduled and follow-up with cardiology. Decrease in ARB per cardiology recommendation employed with new Rx provided. He did complete sleep study and is awaiting results and next steps.

## 2021-05-17 ENCOUNTER — Other Ambulatory Visit: Payer: Self-pay | Admitting: Internal Medicine

## 2021-05-17 DIAGNOSIS — M7989 Other specified soft tissue disorders: Secondary | ICD-10-CM

## 2021-05-17 DIAGNOSIS — R0602 Shortness of breath: Secondary | ICD-10-CM

## 2021-05-18 ENCOUNTER — Other Ambulatory Visit: Payer: Self-pay

## 2021-05-18 ENCOUNTER — Ambulatory Visit (INDEPENDENT_AMBULATORY_CARE_PROVIDER_SITE_OTHER): Payer: 59

## 2021-05-18 DIAGNOSIS — R0602 Shortness of breath: Secondary | ICD-10-CM | POA: Diagnosis not present

## 2021-05-18 DIAGNOSIS — I10 Essential (primary) hypertension: Secondary | ICD-10-CM

## 2021-05-18 DIAGNOSIS — M7989 Other specified soft tissue disorders: Secondary | ICD-10-CM

## 2021-05-18 LAB — ECHOCARDIOGRAM COMPLETE
AR max vel: 2.51 cm2
AV Area VTI: 2.68 cm2
AV Area mean vel: 2.63 cm2
AV Mean grad: 3 mmHg
AV Peak grad: 5.9 mmHg
Ao pk vel: 1.21 m/s
Area-P 1/2: 4.12 cm2
S' Lateral: 3.5 cm

## 2021-05-18 MED ORDER — PERFLUTREN LIPID MICROSPHERE
1.0000 mL | INTRAVENOUS | Status: AC | PRN
  Administered 2021-05-18: 2 mL via INTRAVENOUS

## 2021-05-19 ENCOUNTER — Telehealth: Payer: Self-pay | Admitting: Internal Medicine

## 2021-05-19 NOTE — Telephone Encounter (Signed)
Called to give the patient echo and renal artery duplex results. Lmtcb.

## 2021-05-19 NOTE — Telephone Encounter (Signed)
Patient is returning your call.  

## 2021-05-19 NOTE — Telephone Encounter (Signed)
End, Harrell Gave, MD  P Cv Div [place] Triage Please let Mr. Girgis know that his echocardiogram shows that his is contracting well but is moderately stiffened, likely related to his uncontrolled hypertension and obesity.  This could be contributing to some of his shortness of breath and swelling.  The renal artery Doppler did not show any narrowing in the arteries supplying his kidneys.  I recommend that he continue his current medications and follow-up as previously arranged to reassess his symptoms and blood pressure.

## 2021-05-19 NOTE — Telephone Encounter (Signed)
Patient made aware of results with verbalized understanding. 

## 2021-05-24 ENCOUNTER — Ambulatory Visit: Payer: 59 | Admitting: Medical

## 2021-06-09 ENCOUNTER — Other Ambulatory Visit: Payer: Self-pay

## 2021-06-09 ENCOUNTER — Ambulatory Visit (INDEPENDENT_AMBULATORY_CARE_PROVIDER_SITE_OTHER): Payer: 59 | Admitting: Endocrinology

## 2021-06-09 ENCOUNTER — Encounter: Payer: Self-pay | Admitting: Endocrinology

## 2021-06-09 VITALS — BP 198/104 | HR 91 | Ht 63.0 in | Wt 245.0 lb

## 2021-06-09 DIAGNOSIS — E1122 Type 2 diabetes mellitus with diabetic chronic kidney disease: Secondary | ICD-10-CM | POA: Diagnosis not present

## 2021-06-09 DIAGNOSIS — E1100 Type 2 diabetes mellitus with hyperosmolarity without nonketotic hyperglycemic-hyperosmolar coma (NKHHC): Secondary | ICD-10-CM | POA: Diagnosis not present

## 2021-06-09 DIAGNOSIS — Z794 Long term (current) use of insulin: Secondary | ICD-10-CM

## 2021-06-09 DIAGNOSIS — N184 Chronic kidney disease, stage 4 (severe): Secondary | ICD-10-CM

## 2021-06-09 LAB — POCT GLYCOSYLATED HEMOGLOBIN (HGB A1C): Hemoglobin A1C: 8 % — AB (ref 4.0–5.6)

## 2021-06-09 MED ORDER — LANTUS SOLOSTAR 100 UNIT/ML ~~LOC~~ SOPN: 50.0000 [IU] | PEN_INJECTOR | SUBCUTANEOUS | 99 refills | Status: DC

## 2021-06-09 NOTE — Progress Notes (Signed)
Subjective:    Patient ID: David Klein, male    DOB: May 30, 1970, 51 y.o.   MRN: [id number]  HPI pt is referred by Dr Zigmund Daniel, for diabetes.  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   He has chronic weight gain.  He takes Lantus, 20 units BID, and amaryl.  Pt says cbg varies from 67-233.  It is in general lowest fasting.   Past Medical History:  Diagnosis Date   Allergy    Hyperlipidemia    Hypertension     Past Surgical History:  Procedure Laterality Date   WISDOM TOOTH EXTRACTION Bilateral     Social History   Socioeconomic History   Marital status: Single    Spouse name: Not on file   Number of children: 0   Years of education: 103   Highest education level: Master's degree (e.g., MA, MS, MEng, MEd, MSW, MBA)  Occupational History   Not on file  Tobacco Use   Smoking status: Some Days    Types: Cigars   Smokeless tobacco: Never   Tobacco comments:    1 cigar every few months.  Vaping Use   Vaping Use: Never used  Substance and Sexual Activity   Alcohol use: Yes    Comment: Few drinks every few months.   Drug use: Never   Sexual activity: Not Currently    Partners: Female  Other Topics Concern   Not on file  Social History Narrative   Not on file   Social Determinants of Health   Financial Resource Strain: Not on file  Food Insecurity: Not on file  Transportation Needs: Not on file  Physical Activity: Not on file  Stress: Not on file  Social Connections: Not on file  Intimate Partner Violence: Not on file    Current Outpatient Medications on File Prior to Visit  Medication Sig Dispense Refill   amLODipine (NORVASC) 10 MG tablet Take 1 tablet (10 mg total) by mouth daily. 90 tablet 1   aspirin 81 MG EC tablet Take 1  tablet (81 mg total) by mouth daily. 30 tablet 12   blood glucose meter kit and supplies KIT Dispense based on patient and insurance preference. Use up to four times daily as directed. 1 each 0   carvedilol (COREG) 6.25 MG tablet Take 1 tablet (6.25 mg total) by mouth 2 (two) times daily. 60 tablet 2   diphenhydrAMINE HCl (BENADRYL ALLERGY PO) Take by mouth 2 (two) times daily as needed.     insulin glargine (LANTUS) 100 UNIT/ML injection Inject 0.2 mLs (20 Units total) into the skin 2 (two) times daily. 10 mL 2   Insulin Pen Needle (PEN NEEDLES) 31G X 5 MM MISC 15 Units by Does not apply route at bedtime. 100 each 1   losartan (COZAAR) 25 MG tablet Take 1 tablet (25 mg total) by mouth daily. 90 tablet 0   rosuvastatin (CRESTOR) 40 MG tablet Take 1 tablet (40 mg total) by mouth daily. 90 tablet 1   triamcinolone (NASACORT) 55 MCG/ACT AERO nasal inhaler 2 sprays daily.     No current facility-administered medications on file prior to visit.    No Known Allergies  Family History  Problem Relation Age of Onset   Hypertension Mother  Hypertension Father    Lung cancer Father    Diabetes Sister    Hypertension Sister    Diabetes Sister    Hypertension Sister    Hypertension Brother    Stroke Paternal Grandmother     BP (!) 198/104    Pulse 91    Ht $R'5\' 3"'Ad$  (1.6 m)    Wt 245 lb (111.1 kg)    SpO2 97%    BMI 43.40 kg/m    Review of Systems denies sob, n/v, memory loss.      Objective:   Physical Exam VITAL SIGNS:  See vs page GENERAL: no distress Pulses: dorsalis pedis intact bilat.   MSK: no deformity of the feet CV: 2+ bilat leg edema Skin:  no ulcer on the feet.  normal color and temp on the feet. Neuro: sensation is intact to touch on the feet.    Lab Results  Component Value Date   CREATININE 3.08 (H) 03/20/2021   BUN 25 (H) 03/20/2021   NA 138 03/20/2021   K 4.8 03/20/2021   CL 102 03/20/2021   CO2 22 03/20/2021    Lab Results  Component Value Date   HGBA1C 8.2  (A) 05/15/2021    I have reviewed outside records, and summarized: Pt was noted to have elevated A1c, and referred here.  He was admitted with [place] late 2022.       Assessment & Plan:  Insulin-requiring type 2 DM: uncontrolled.    Patient Instructions  Your blood pressure is high today.  Please see your primary care provider soon, to have it rechecked.   good diet and exercise significantly improve the control of your diabetes.  please let me know if you wish to be referred to a dietician.  high blood sugar is very risky to your health.  you should see an eye doctor and dentist every year.  It is very important to get all recommended vaccinations.  Controlling your blood pressure and cholesterol drastically reduces the damage diabetes does to your body.  Those who smoke should quit.  Please discuss these with your doctor.  check your blood sugar twice a day.  vary the time of day when you check, between before the 3 meals, and at bedtime.  also check if you have symptoms of your blood sugar being too high or too low.  please keep a record of the readings and bring it to your next appointment here (or you can bring the meter itself).  You can write it on any piece of paper.  please call us sooner if your blood sugar goes below 70, or if most of your readings are over 200.   We will need to take this complex situation in stages.   For now, please stop taking the glimepiride, and increase the insulin to 50 units each morning (you do not have to take any in the evening).   Please come back for a follow-up appointment in 2 weeks.

## 2021-06-09 NOTE — Patient Instructions (Addendum)
Your blood pressure is high today.  Please see your primary care provider soon, to have it rechecked.   good diet and exercise significantly improve the control of your diabetes.  please let me know if you wish to be referred to a dietician.  high blood sugar is very risky to your health.  you should see an eye doctor and dentist every year.  It is very important to get all recommended vaccinations.  Controlling your blood pressure and cholesterol drastically reduces the damage diabetes does to your body.  Those who smoke should quit.  Please discuss these with your doctor.  check your blood sugar twice a day.  vary the time of day when you check, between before the 3 meals, and at bedtime.  also check if you have symptoms of your blood sugar being too high or too low.  please keep a record of the readings and bring it to your next appointment here (or you can bring the meter itself).  You can write it on any piece of paper.  please call us sooner if your blood sugar goes below 70, or if most of your readings are over 200.   We will need to take this complex situation in stages.   For now, please stop taking the glimepiride, and increase the insulin to 50 units each morning (you do not have to take any in the evening).   Please come back for a follow-up appointment in 2 weeks.

## 2021-06-19 ENCOUNTER — Ambulatory Visit (INDEPENDENT_AMBULATORY_CARE_PROVIDER_SITE_OTHER): Payer: 59 | Admitting: Medical

## 2021-06-19 ENCOUNTER — Other Ambulatory Visit: Payer: Self-pay

## 2021-06-19 ENCOUNTER — Encounter: Payer: Self-pay | Admitting: Medical

## 2021-06-19 VITALS — BP 190/120 | HR 74 | Ht 62.0 in | Wt 246.0 lb

## 2021-06-19 DIAGNOSIS — I5032 Chronic diastolic (congestive) heart failure: Secondary | ICD-10-CM | POA: Diagnosis not present

## 2021-06-19 DIAGNOSIS — R0602 Shortness of breath: Secondary | ICD-10-CM | POA: Diagnosis not present

## 2021-06-19 DIAGNOSIS — M7989 Other specified soft tissue disorders: Secondary | ICD-10-CM | POA: Diagnosis not present

## 2021-06-19 DIAGNOSIS — I10 Essential (primary) hypertension: Secondary | ICD-10-CM | POA: Diagnosis not present

## 2021-06-19 DIAGNOSIS — N183 Chronic kidney disease, stage 3 unspecified: Secondary | ICD-10-CM | POA: Diagnosis not present

## 2021-06-19 MED ORDER — AMLODIPINE BESYLATE 5 MG PO TABS: 5.0000 mg | ORAL_TABLET | Freq: Every day | ORAL | 3 refills | Status: DC

## 2021-06-19 MED ORDER — CARVEDILOL 12.5 MG PO TABS: 12.5000 mg | ORAL_TABLET | Freq: Two times a day (BID) | ORAL | 2 refills | Status: DC

## 2021-06-19 MED ORDER — TORSEMIDE 20 MG PO TABS: 20.0000 mg | ORAL_TABLET | Freq: Two times a day (BID) | ORAL | 3 refills | Status: DC

## 2021-06-19 MED ORDER — HYDRALAZINE HCL 50 MG PO TABS
50.0000 mg | ORAL_TABLET | Freq: Three times a day (TID) | ORAL | 3 refills | Status: DC
Start: 2021-06-19 — End: 2021-07-21

## 2021-06-19 NOTE — Progress Notes (Signed)
Cardiology Office Note:    Date:  06/19/2021   ID:  David Klein, DOB April 16, 1971, MRN [id number]  PCP:  Montel Culver, MD  [facility] Cardiologist:  None  [facility] Electrophysiologist:  None   Referring MD: Montel Culver, MD   Chief Complaint: echo/renal US f/u  History of Present Illness:    David Klein is a 51 y.o. male with a hx of TN, DM2, OSA, CKD who presents for follow-up.   Hospitalization in 2022 with hypertensive urgency and hyperosmolar hyperglycemic state complicated by AKI on CKD. Referred to cardiology for BP control.   Patient was seen 04/12/21 and reported intermittent SOB and LLE on torsemide. BP was elevated and heart rate was high. Coreg was added. Echo and renal US were ordered.   Today, BP is high very high 190/120. Reports it has been persistently high. Last week it was 198/104 at endocrinology. Patient did take his medications this morning. He feels OK, a little tired. No lightheadedness or dizziness. He is going to get a CPAP machine. He needs to re-schedule with nephrology. Breathing is the same. He has DOE. Reports swelling on the feet. Does not eat a lot of salt.says he ran out of torsemide and hydralazin was never refilled.   Past Medical History:  Diagnosis Date   Allergy    Hyperlipidemia    Hypertension     Past Surgical History:  Procedure Laterality Date   TOOTH EXTRACTION     WISDOM TOOTH EXTRACTION Bilateral     Current Medications: Current Meds  Medication Sig   aspirin 81 MG EC tablet Take 1 tablet (81 mg total) by mouth daily.   blood glucose meter kit and supplies KIT Dispense based on patient and insurance preference. Use up to four times daily as directed.   diphenhydrAMINE HCl (BENADRYL ALLERGY PO) Take by mouth 2 (two) times daily as needed.   hydrALAZINE (APRESOLINE) 50 MG tablet Take 1 tablet (50 mg total) by mouth 3 (three) times daily.   insulin glargine (LANTUS SOLOSTAR) 100 UNIT/ML Solostar Pen  Inject 50 Units into the skin every morning. And pen needles 1/day.   insulin glargine (LANTUS) 100 UNIT/ML injection Inject 0.2 mLs (20 Units total) into the skin 2 (two) times daily.   Insulin Pen Needle (PEN NEEDLES) 31G X 5 MM MISC 15 Units by Does not apply route at bedtime.   losartan (COZAAR) 25 MG tablet Take 1 tablet (25 mg total) by mouth daily.   rosuvastatin (CRESTOR) 40 MG tablet Take 1 tablet (40 mg total) by mouth daily.   torsemide (DEMADEX) 20 MG tablet Take 1 tablet (20 mg total) by mouth 2 (two) times daily.   triamcinolone (NASACORT) 55 MCG/ACT AERO nasal inhaler 2 sprays daily.   [DISCONTINUED] amLODipine (NORVASC) 10 MG tablet Take 1 tablet (10 mg total) by mouth daily.   [DISCONTINUED] carvedilol (COREG) 6.25 MG tablet Take 1 tablet (6.25 mg total) by mouth 2 (two) times daily.     Allergies:   Patient has no known allergies.   Social History   Socioeconomic History   Marital status: Single    Spouse name: Not on file   Number of children: 0   Years of education: 18   Highest education level: Master's degree (e.g., MA, MS, MEng, MEd, MSW, MBA)  Occupational History   Not on file  Tobacco Use   Smoking status: Some Days    Types: Cigars   Smokeless tobacco: Never  Tobacco comments:    1 cigar every few months.  Vaping Use   Vaping Use: Never used  Substance and Sexual Activity   Alcohol use: Yes    Comment: Few drinks every few months.   Drug use: Never   Sexual activity: Not Currently    Partners: Female  Other Topics Concern   Not on file  Social History Narrative   Not on file   Social Determinants of Health   Financial Resource Strain: Not on file  Food Insecurity: Not on file  Transportation Needs: Not on file  Physical Activity: Not on file  Stress: Not on file  Social Connections: Not on file     Family History: The patient's family history includes Diabetes in his sister and sister; Hypertension in his brother, father, mother,  sister, and sister; Lung cancer in his father; Stroke in his paternal grandmother.  ROS:   Please see the history of present illness.     All other systems reviewed and are negative.  EKGs/Labs/Other Studies Reviewed:    The following studies were reviewed today:  Echo 04/2021  1. Left ventricular ejection fraction, by estimation, is 55 to 60%. The  left ventricle has normal function. The left ventricle has no regional  wall motion abnormalities. There is mild to moderate left ventricular  hypertrophy. Left ventricular diastolic  parameters are consistent with Grade II diastolic dysfunction  (pseudonormalization).   2. Right ventricular systolic function is normal. The right ventricular  size is normal. Tricuspid regurgitation signal is inadequate for assessing  PA pressure.   3. Left atrial size was mildly dilated.   4. The mitral valve is normal in structure. Mild mitral valve  regurgitation. No evidence of mitral stenosis.   5. The aortic valve was not well visualized. Aortic valve regurgitation  is not visualized. No aortic stenosis is present.   6. The inferior vena cava is normal in size with greater than 50%  respiratory variability, suggesting right atrial pressure of 3 mmHg.   Renal US 04/2021 Right: Normal size right kidney. Normal right Resisitive Index.         Abnormal cortical thickness of right kidney. No evidence of         right renal artery stenosis. RRV flow present.  Left:  LRV flow present. No evidence of left renal artery stenosis.         Normal size of left kidney. Normal left Resistive Index.         Abnormal cortical thickness of the left kidney.  Mesenteric:  Normal Celiac artery and Superior Mesenteric artery findings.  Ectatic aorta.    EKG:  EKG is  ordered today.    Recent Labs: 02/04/2021: ALT 20 03/20/2021: BUN 25; Creatinine, Ser 3.08; Hemoglobin 10.8; Platelets 367; Potassium 4.8; Sodium 138  Recent Lipid Panel    Component Value  Date/Time   CHOL 281 (H) 02/05/2021 0552   TRIG 442 (H) 02/05/2021 0552   HDL 47 02/05/2021 0552   CHOLHDL 6.0 02/05/2021 0552   VLDL UNABLE TO CALCULATE IF TRIGLYCERIDE OVER 400 mg/dL 02/05/2021 0552   LDLCALC UNABLE TO CALCULATE IF TRIGLYCERIDE OVER 400 mg/dL 02/05/2021 0552   LDLDIRECT 152.5 (H) 02/05/2021 0552    Physical Exam:    VS:  BP (!) 190/120 (BP Location: Left Arm, Patient Position: Sitting, Cuff Size: Large)    Pulse 74    Ht 5' 2" (1.575 m)    Wt 246 lb (111.6 kg)    SpO2 99%  BMI 44.99 kg/m     Wt Readings from Last 3 Encounters:  06/19/21 246 lb (111.6 kg)  06/09/21 245 lb (111.1 kg)  05/15/21 238 lb (108 kg)     GEN:  Well nourished, well developed in no acute distress HEENT: Normal NECK: No JVD; No carotid bruits LYMPHATICS: No lymphadenopathy CARDIAC: RRR, no murmurs, rubs, gallops RESPIRATORY:  Clear to auscultation without rales, wheezing or rhonchi  ABDOMEN: Soft, non-tender, non-distended MUSCULOSKELETAL:  No edema; No deformity  SKIN: Warm and dry NEUROLOGIC:  Alert and oriented x 3 PSYCHIATRIC:  Normal affect   ASSESSMENT:    1. Hypertension, unspecified type   2. Shortness of breath   3. Leg swelling   4. Chronic diastolic heart failure ([place])   5. Morbid obesity ([place])   6. Uncontrolled hypertension   7. Stage 3 chronic kidney disease, unspecified whether stage 3a or 3b CKD (HCC)    PLAN:    In order of problems listed above:  SOB and leg swelling Diastolic dysfunction Echo showed normal LVEF with G2DD. He reports unchanged breathing. Also he is getting a CPAP machine, which will hopefully improve breathing. He is no longer on torsemide. There is LLE on exam. I will decrease amlodipine to 35m daily. Restart torsemide 250mdaily BMET in a week.  Uncontrolled HTN Renal USKoreahowed no stenosis. BP severely elevated today and on re-check. Says he ran out of hydralazine. I will restart hydralazine 5015mID. I will increase Coreg to 12.5mg61mD.  continue losartan, although may need to d/c given CKD, wil defer to nephrology.   CKD Patient needing to re-schedule nephrology appointment, recommendations greatly appreciated. Baseline Scr 2.5-3. May need to discontinue Losartan as above. Dapagliflozin previously discontinued.   Obesity Weight loss recommended  OSA He awaiting a CPAP  Disposition: Follow up in 1 month(s) with MD/APP      Signed, Cadence H FuNinfa Meeker-C  06/19/2021 4:52 PM    [facility]

## 2021-06-19 NOTE — Patient Instructions (Signed)
Medication Instructions:  Your physician has recommended you make the following change in your medication:   RESTART hydralazine (Apresoline) 50 mg three times daily   RESTART torsemide (Demadex) 20 mg two times daily   INCREASE your carvedilol (Coreg) to 12.5 mg two times daily   DECREASE your amlodpinie (Norvasc) to 5 mg daily   *If you need a refill on your cardiac medications before your next appointment, please call your pharmacy*   Lab Work: Your physician recommends that you return for lab work (BMET) in: 1 week    Please return to our office on_____________________at______________am/pm   If you have labs (blood work) drawn today and your tests are completely normal, you will receive your results only by: [place] (if you have MyChart) OR A paper copy in the mail If you have any lab test that is abnormal or we need to change your treatment, we will call you to review the results.   Testing/Procedures: None ordered   Follow-Up: At [facility], you and your health needs are our priority.  As part of our continuing mission to provide you with exceptional heart care, we have created designated Provider Care Teams.  These Care Teams include your primary Cardiologist (physician) and Advanced Practice Providers (APPs -  Physician Assistants and Nurse Practitioners) who all work together to provide you with the care you need, when you need it.  We recommend signing up for the patient portal called "MyChart".  Sign up information is provided on this After Visit Summary.  MyChart is used to connect with patients for Virtual Visits (Telemedicine).  Patients are able to view lab/test results, encounter notes, upcoming appointments, etc.  Non-urgent messages can be sent to your provider as well.   To learn more about what you can do with MyChart, go to [email or web address].    Your next appointment:   1 month(s)  The format for your next appointment:   In  Person  Provider:   You may see Nelva Bush, MD or one of the following Advanced Practice Providers on your designated Care Team:   Murray Hodgkins, NP Christell Faith, PA-C Cadence Kathlen Mody, [place]   Other Instructions N/A

## 2021-06-22 DIAGNOSIS — E1122 Type 2 diabetes mellitus with diabetic chronic kidney disease: Secondary | ICD-10-CM | POA: Diagnosis not present

## 2021-06-22 DIAGNOSIS — N184 Chronic kidney disease, stage 4 (severe): Secondary | ICD-10-CM | POA: Insufficient documentation

## 2021-06-22 DIAGNOSIS — R801 Persistent proteinuria, unspecified: Secondary | ICD-10-CM | POA: Insufficient documentation

## 2021-06-22 DIAGNOSIS — R600 Localized edema: Secondary | ICD-10-CM | POA: Diagnosis not present

## 2021-06-22 DIAGNOSIS — I10 Essential (primary) hypertension: Secondary | ICD-10-CM | POA: Diagnosis not present

## 2021-06-23 ENCOUNTER — Other Ambulatory Visit: Payer: Self-pay

## 2021-06-23 ENCOUNTER — Ambulatory Visit (INDEPENDENT_AMBULATORY_CARE_PROVIDER_SITE_OTHER): Payer: 59 | Admitting: Endocrinology

## 2021-06-23 VITALS — BP 196/100 | HR 97 | Ht 62.0 in | Wt 246.4 lb

## 2021-06-23 DIAGNOSIS — E1122 Type 2 diabetes mellitus with diabetic chronic kidney disease: Secondary | ICD-10-CM

## 2021-06-23 DIAGNOSIS — N184 Chronic kidney disease, stage 4 (severe): Secondary | ICD-10-CM

## 2021-06-23 DIAGNOSIS — Z794 Long term (current) use of insulin: Secondary | ICD-10-CM

## 2021-06-23 MED ORDER — FREESTYLE LIBRE 2 SENSOR MISC: 1.0000 | 3 refills | Status: DC

## 2021-06-23 MED ORDER — LANTUS SOLOSTAR 100 UNIT/ML ~~LOC~~ SOPN: 40.0000 [IU] | PEN_INJECTOR | SUBCUTANEOUS | 99 refills | Status: AC

## 2021-06-23 MED ORDER — OZEMPIC (0.25 OR 0.5 MG/DOSE) 2 MG/1.5ML ~~LOC~~ SOPN: 0.2500 mg | PEN_INJECTOR | SUBCUTANEOUS | 3 refills | Status: DC

## 2021-06-23 NOTE — Progress Notes (Signed)
° °Subjective:  ° ° Patient ID: David Klein, male    DOB: 02/09/1971, 50 y.o.   MRN: [id number] ° °HPI °Pt returns for f/u of diabetes mellitus: °DM type: Insulin-requiring type 2 °Dx'ed: 2002 °Complications: stage 4 CRF °Therapy: insulin intermittently since 2018, and consistently since 2022 °DKA: never °Severe hypoglycemia: never °Pancreatitis: never °Pancreatic imaging: none known °SDOH: none °Other: he takes qd insulin, at least for now; goal is to d/c insulin if possible °Interval history: no cbg record, but states cbg's vary from 86-175.  It is in general higher as the day goes on.  He says he might have fasting hypoglycemia, but he did not check at time of sxs.  He has BP check at cardiol in 3 days °Past Medical History:  °Diagnosis Date  ° Allergy   ° Hyperlipidemia   ° Hypertension   ° ° °Past Surgical History:  °Procedure Laterality Date  ° TOOTH EXTRACTION    ° WISDOM TOOTH EXTRACTION Bilateral   ° ° °Social History  ° °Socioeconomic History  ° Marital status: Single  °  Spouse name: Not on file  ° Number of children: 0  ° Years of education: 18  ° Highest education level: Master's degree (e.g., MA, MS, MEng, MEd, MSW, MBA)  °Occupational History  ° Not on file  °Tobacco Use  ° Smoking status: Some Days  °  Types: Cigars  ° Smokeless tobacco: Never  ° Tobacco comments:  °  1 cigar every few months.  °Vaping Use  ° Vaping Use: Never used  °Substance and Sexual Activity  ° Alcohol use: Yes  °  Comment: Few drinks every few months.  ° Drug use: Never  ° Sexual activity: Not Currently  °  Partners: Female  °Other Topics Concern  ° Not on file  °Social History Narrative  ° Not on file  ° °Social Determinants of Health  ° °Financial Resource Strain: Not on file  °Food Insecurity: Not on file  °Transportation Needs: Not on file  °Physical Activity: Not on file  °Stress: Not on file  °Social Connections: Not on file  °Intimate Partner Violence: Not on file  ° ° °Current Outpatient Medications on File Prior  to Visit  °Medication Sig Dispense Refill  ° amLODipine (NORVASC) 5 MG tablet Take 1 tablet (5 mg total) by mouth daily. 90 tablet 3  ° aspirin 81 MG EC tablet Take 1 tablet (81 mg total) by mouth daily. 30 tablet 12  ° blood glucose meter kit and supplies KIT Dispense based on patient and insurance preference. Use up to four times daily as directed. 1 each 0  ° carvedilol (COREG) 12.5 MG tablet Take 1 tablet (12.5 mg total) by mouth 2 (two) times daily. 60 tablet 2  ° diphenhydrAMINE HCl (BENADRYL ALLERGY PO) Take by mouth 2 (two) times daily as needed.    ° hydrALAZINE (APRESOLINE) 50 MG tablet Take 1 tablet (50 mg total) by mouth 3 (three) times daily. 270 tablet 3  ° Insulin Pen Needle (PEN NEEDLES) 31G X 5 MM MISC 15 Units by Does not apply route at bedtime. 100 each 1  ° losartan (COZAAR) 25 MG tablet Take 1 tablet (25 mg total) by mouth daily. 90 tablet 0  ° rosuvastatin (CRESTOR) 40 MG tablet Take 1 tablet (40 mg total) by mouth daily. 90 tablet 1  ° torsemide (DEMADEX) 20 MG tablet Take 1 tablet (20 mg total) by mouth 2 (two) times daily. 180 tablet 3  ° triamcinolone (  NASACORT) 55 MCG/ACT AERO nasal inhaler 2 sprays daily.    ° °No current facility-administered medications on file prior to visit.  ° ° °No Known Allergies ° °Family History  °Problem Relation Age of Onset  ° Hypertension Mother   ° Hypertension Father   ° Lung cancer Father   ° Diabetes Sister   ° Hypertension Sister   ° Diabetes Sister   ° Hypertension Sister   ° Hypertension Brother   ° Stroke Paternal Grandmother   ° ° °BP (!) 196/100    Pulse 97    Ht 5' 2" (1.575 m)    Wt 246 lb 6.4 oz (111.8 kg)    SpO2 96%    BMI 45.07 kg/m²  ° ° °Review of Systems ° °   °Objective:  ° Physical Exam ° ° °Lab Results  °Component Value Date  ° HGBA1C 8.0 (A) 06/09/2021  ° °   °Assessment & Plan:  °Insulin-requiring type 2 DM: uncontrolled.  ° °Patient Instructions  °check your blood sugar twice a day.  vary the time of day when you check, between before  the 3 meals, and at bedtime.  also check if you have symptoms of your blood sugar being too high or too low.  please keep a record of the readings and bring it to your next appointment here (or you can bring the meter itself).  You can write it on any piece of paper.  please call us sooner if your blood sugar goes below 70, or if most of your readings are over 200.  please reduce the insulin to 40 units each morning and I have sent a prescription to your pharmacy, to add Ozempic.   °I have also sent a prescription to your pharmacy, for the continuous glucose monitor sensors.   °Please come back for a follow-up appointment in 1 month.   ° ° °

## 2021-06-23 NOTE — Patient Instructions (Addendum)
check your blood sugar twice a day.  vary the time of day when you check, between before the 3 meals, and at bedtime.  also check if you have symptoms of your blood sugar being too high or too low.  please keep a record of the readings and bring it to your next appointment here (or you can bring the meter itself).  You can write it on any piece of paper.  please call us sooner if your blood sugar goes below 70, or if most of your readings are over 200.  please reduce the insulin to 40 units each morning and I have sent a prescription to your pharmacy, to add Ozempic.   I have also sent a prescription to your pharmacy, for the continuous glucose monitor sensors.   Please come back for a follow-up appointment in 1 month.

## 2021-06-26 ENCOUNTER — Other Ambulatory Visit: Payer: Self-pay

## 2021-06-26 ENCOUNTER — Other Ambulatory Visit: Payer: Self-pay | Admitting: Emergency Medicine

## 2021-06-26 ENCOUNTER — Other Ambulatory Visit (INDEPENDENT_AMBULATORY_CARE_PROVIDER_SITE_OTHER): Payer: 59

## 2021-06-26 DIAGNOSIS — I10 Essential (primary) hypertension: Secondary | ICD-10-CM

## 2021-06-27 ENCOUNTER — Telehealth: Payer: Self-pay | Admitting: Emergency Medicine

## 2021-06-27 DIAGNOSIS — I10 Essential (primary) hypertension: Secondary | ICD-10-CM

## 2021-06-27 LAB — BASIC METABOLIC PANEL WITH GFR
BUN/Creatinine Ratio: 16 (ref 9–20)
BUN: 59 mg/dL — ABNORMAL HIGH (ref 6–24)
CO2: 18 mmol/L — ABNORMAL LOW (ref 20–29)
Calcium: 9.5 mg/dL (ref 8.7–10.2)
Chloride: 104 mmol/L (ref 96–106)
Creatinine, Ser: 3.63 mg/dL — ABNORMAL HIGH (ref 0.76–1.27)
Glucose: 261 mg/dL — ABNORMAL HIGH (ref 70–99)
Potassium: 5 mmol/L (ref 3.5–5.2)
Sodium: 140 mmol/L (ref 134–144)
eGFR: 20 mL/min/1.73 — ABNORMAL LOW

## 2021-06-27 MED ORDER — TORSEMIDE 20 MG PO TABS: 20.0000 mg | ORAL_TABLET | Freq: Every day | ORAL | 3 refills | Status: DC

## 2021-06-27 NOTE — Telephone Encounter (Signed)
Called patient, went over results and recommendations. Pt verbalized understanding.   Pt reports that his swelling is much better and the edema is not as prevalent since he's been taking the lasix and norvasc on a routine basis.   Did want to know if he should take the 10 mg daily or BID as he is currently taking 20 mg BID. Will clarify with PA and call patient back.

## 2021-06-27 NOTE — Addendum Note (Signed)
Addended by: Mila Merry on: 06/27/2021 12:20 PM   Modules accepted: Orders

## 2021-06-27 NOTE — Telephone Encounter (Signed)
-----   Message from [place], PA-C sent at 06/27/2021 10:16 AM EST ----- Labs showed dehydration on torsemide dose, lets go down to 10mg  daily. Also can we ask if the swelling is better? May need to stop amlodipine altogether.

## 2021-06-27 NOTE — Telephone Encounter (Signed)
Called patient. Relayed recommendations. Patient will:   - Hold torsemide for two days starting tomorrow as he has already taken a dose today  - Restart torsemide on Friday at 20 mg daily  - Come in for repeat BMET on 3/8 at 11:45   Patient verbalized understanding and voiced appreciation for call.

## 2021-07-05 ENCOUNTER — Other Ambulatory Visit (INDEPENDENT_AMBULATORY_CARE_PROVIDER_SITE_OTHER): Payer: 59

## 2021-07-05 ENCOUNTER — Other Ambulatory Visit: Payer: Self-pay

## 2021-07-05 DIAGNOSIS — I5032 Chronic diastolic (congestive) heart failure: Secondary | ICD-10-CM

## 2021-07-06 ENCOUNTER — Telehealth: Payer: Self-pay | Admitting: Emergency Medicine

## 2021-07-06 LAB — BASIC METABOLIC PANEL
BUN/Creatinine Ratio: 11 (ref 9–20)
BUN: 36 mg/dL — ABNORMAL HIGH (ref 6–24)
CO2: 20 mmol/L (ref 20–29)
Calcium: 8.9 mg/dL (ref 8.7–10.2)
Chloride: 103 mmol/L (ref 96–106)
Creatinine, Ser: 3.2 mg/dL — ABNORMAL HIGH (ref 0.76–1.27)
Glucose: 231 mg/dL — ABNORMAL HIGH (ref 70–99)
Potassium: 4.8 mmol/L (ref 3.5–5.2)
Sodium: 136 mmol/L (ref 134–144)
eGFR: 23 mL/min/{1.73_m2} — ABNORMAL LOW (ref >59)

## 2021-07-06 NOTE — Telephone Encounter (Signed)
-----   Message from Nelva Bush, MD sent at 07/06/2021  6:49 AM EST ----- ?Renal function remains severely impaired but better compared to 10 days ago and just above the level from November.  Electrolytes are normal.  Continue current medications and follow-up as previously recommended.  I will also forward to Cadence [place], [place] for further recommendations, as she has most recently been following Mr. Gombert. ?

## 2021-07-06 NOTE — Telephone Encounter (Signed)
Called patient to go over results. No answer. Lmtcb. 

## 2021-07-07 ENCOUNTER — Other Ambulatory Visit: Payer: Self-pay | Admitting: Internal Medicine

## 2021-07-13 ENCOUNTER — Telehealth: Payer: Self-pay | Admitting: *Deleted

## 2021-07-13 NOTE — Telephone Encounter (Signed)
Attempted to call pt. No answer. Lmtcb.  

## 2021-07-13 NOTE — Telephone Encounter (Signed)
-----   Message from Nelva Bush, MD sent at 07/06/2021  6:49 AM EST ----- ?Renal function remains severely impaired but better compared to 10 days ago and just above the level from November.  Electrolytes are normal.  Continue current medications and follow-up as previously recommended.  I will also forward to Cadence [place], [place] for further recommendations, as she has most recently been following Mr. Macke. ?

## 2021-07-13 NOTE — Telephone Encounter (Signed)
Patient returning call.

## 2021-07-14 NOTE — Telephone Encounter (Signed)
Pt notified of results and Dr. Darnelle Bos recc below.  ?Pt voiced understanding and has no further questions.  ?Pt will follow up as scheduled with Cadence 07/21/21.  ?

## 2021-07-21 ENCOUNTER — Encounter: Payer: Self-pay | Admitting: Medical

## 2021-07-21 ENCOUNTER — Ambulatory Visit (INDEPENDENT_AMBULATORY_CARE_PROVIDER_SITE_OTHER): Payer: 59 | Admitting: Medical

## 2021-07-21 ENCOUNTER — Other Ambulatory Visit: Payer: Self-pay

## 2021-07-21 VITALS — BP 160/90 | HR 76 | Ht 63.0 in | Wt 237.1 lb

## 2021-07-21 DIAGNOSIS — M7989 Other specified soft tissue disorders: Secondary | ICD-10-CM

## 2021-07-21 DIAGNOSIS — N183 Chronic kidney disease, stage 3 unspecified: Secondary | ICD-10-CM

## 2021-07-21 DIAGNOSIS — N179 Acute kidney failure, unspecified: Secondary | ICD-10-CM

## 2021-07-21 DIAGNOSIS — N189 Chronic kidney disease, unspecified: Secondary | ICD-10-CM | POA: Diagnosis not present

## 2021-07-21 DIAGNOSIS — I10 Essential (primary) hypertension: Secondary | ICD-10-CM

## 2021-07-21 DIAGNOSIS — G4733 Obstructive sleep apnea (adult) (pediatric): Secondary | ICD-10-CM | POA: Diagnosis not present

## 2021-07-21 DIAGNOSIS — I5032 Chronic diastolic (congestive) heart failure: Secondary | ICD-10-CM | POA: Diagnosis not present

## 2021-07-21 MED ORDER — HYDRALAZINE HCL 100 MG PO TABS: 100.0000 mg | ORAL_TABLET | Freq: Three times a day (TID) | ORAL | 3 refills | Status: DC

## 2021-07-21 MED ORDER — CARVEDILOL 25 MG PO TABS: 25.0000 mg | ORAL_TABLET | Freq: Two times a day (BID) | ORAL | 2 refills | Status: DC

## 2021-07-21 NOTE — Progress Notes (Signed)
?Cardiology Office Note:   ? ?Date:  07/21/2021  ? ?ID:  David Klein, DOB May 18, 1970, MRN [id number] ? ?PCP:  David Culver, MD  ?[facility] Cardiologist:  None  ?[place] Electrophysiologist:  None  ? ?Referring MD: David Culver, MD  ? ?Chief Complaint: 1 month follow-up ? ?History of Present Illness:   ? ?David Klein is a 51 y.o. male with a hx of HFpEF, HTN, DM2, OSA, CKD stage 3 who presents for follow-up.  ?  ?Hospitalization in 2022 with hypertensive urgency and hyperosmolar hyperglycemic state complicated by AKI on CKD. Referred to cardiology for BP control.  ?  ?Patient was seen 04/12/21 and reported intermittent SOB and LLE on torsemide. BP was elevated and heart rate was high. Coreg was added. Echo and renal US were ordered.  ? ?Last seen 06/19/21 and BP was very high. Amlodipine was decreased for LLE. Torsemide 66m was restarted. Hydralazine was restarted and Coreg was increased.  ? ?Labs came back with elevated kidney function and Torsemide was decreased to 231mdaily. Follow-up labs were better but not back to baseline.  ? ?Today, the patient reports lower leg swelling is better. It is worse with certain foods. No chest pain or shortness of breath. Has not received CPAP machine, he plans on checking on this. BP is still high. He saw nephrology earlier this month, no changes were made.  ? ?Past Medical History:  ?Diagnosis Date  ? Allergy   ? Hyperlipidemia   ? Hypertension   ? ? ?Past Surgical History:  ?Procedure Laterality Date  ? TOOTH EXTRACTION    ? WISDOM TOOTH EXTRACTION Bilateral   ? ? ?Current Medications: ?Current Meds  ?Medication Sig  ? aspirin 81 MG EC tablet Take 1 tablet (81 mg total) by mouth daily.  ? blood glucose meter kit and supplies KIT Dispense based on patient and insurance preference. Use up to four times daily as directed.  ? Continuous Blood Gluc Sensor (FREESTYLE LIBRE 2 SENSOR) MISC 1 Device by Does not apply route every 14 (fourteen) days.  ?  diphenhydrAMINE HCl (BENADRYL ALLERGY PO) Take by mouth 2 (two) times daily as needed.  ? insulin glargine (LANTUS SOLOSTAR) 100 UNIT/ML Solostar Pen Inject 40 Units into the skin every morning. And pen needles 1/day.  ? Insulin Pen Needle (PEN NEEDLES) 31G X 5 MM MISC 15 Units by Does not apply route at bedtime.  ? losartan (COZAAR) 25 MG tablet Take 1 tablet (25 mg total) by mouth daily.  ? rosuvastatin (CRESTOR) 40 MG tablet Take 1 tablet (40 mg total) by mouth daily.  ? Semaglutide,0.25 or 0.5MG/DOS, (OZEMPIC, 0.25 OR 0.5 MG/DOSE,) 2 MG/1.5ML SOPN Inject 0.25 mg into the skin once a week.  ? triamcinolone (NASACORT) 55 MCG/ACT AERO nasal inhaler 2 sprays daily.  ? [DISCONTINUED] amLODipine (NORVASC) 5 MG tablet Take 1 tablet (5 mg total) by mouth daily.  ? [DISCONTINUED] carvedilol (COREG) 12.5 MG tablet Take 1 tablet (12.5 mg total) by mouth 2 (two) times daily.  ? [DISCONTINUED] hydrALAZINE (APRESOLINE) 50 MG tablet Take 1 tablet (50 mg total) by mouth 3 (three) times daily.  ? [DISCONTINUED] torsemide (DEMADEX) 20 MG tablet Take 1 tablet (20 mg total) by mouth daily.  ?  ? ?Allergies:   Patient has no known allergies.  ? ?Social History  ? ?Socioeconomic History  ? Marital status: Single  ?  Spouse name: Not on file  ? Number of children: 0  ? Years of education:  18  ? Highest education level: Master's degree (e.g., MA, MS, MEng, MEd, MSW, MBA)  ?Occupational History  ? Not on file  ?Tobacco Use  ? Smoking status: Some Days  ?  Types: Cigars  ? Smokeless tobacco: Never  ? Tobacco comments:  ?  1 cigar every few months.  ?Vaping Use  ? Vaping Use: Never used  ?Substance and Sexual Activity  ? Alcohol use: Yes  ?  Comment: Few drinks every few months.  ? Drug use: Never  ? Sexual activity: Not Currently  ?  Partners: Female  ?Other Topics Concern  ? Not on file  ?Social History Narrative  ? Not on file  ? ?Social Determinants of Health  ? ?Financial Resource Strain: Not on file  ?Food Insecurity: Not on file   ?Transportation Needs: Not on file  ?Physical Activity: Not on file  ?Stress: Not on file  ?Social Connections: Not on file  ?  ? ?Family History: ?The patient's family history includes Diabetes in his sister and sister; Hypertension in his brother, father, mother, sister, and sister; Lung cancer in his father; Stroke in his paternal grandmother. ? ?ROS:   ?Please see the history of present illness.    ? All other systems reviewed and are negative. ? ?EKGs/Labs/Other Studies Reviewed:   ? ?The following studies were reviewed today: ? ?Echo 04/2021 ? 1. Left ventricular ejection fraction, by estimation, is 55 to 60%. The  ?left ventricle has normal function. The left ventricle has no regional  ?wall motion abnormalities. There is mild to moderate left ventricular  ?hypertrophy. Left ventricular diastolic  ?parameters are consistent with Grade II diastolic dysfunction  ?(pseudonormalization).  ? 2. Right ventricular systolic function is normal. The right ventricular  ?size is normal. Tricuspid regurgitation signal is inadequate for assessing  ?PA pressure.  ? 3. Left atrial size was mildly dilated.  ? 4. The mitral valve is normal in structure. Mild mitral valve  ?regurgitation. No evidence of mitral stenosis.  ? 5. The aortic valve was not well visualized. Aortic valve regurgitation  ?is not visualized. No aortic stenosis is present.  ? 6. The inferior vena cava is normal in size with greater than 50%  ?respiratory variability, suggesting right atrial pressure of 3 mmHg.  ?  ?Renal US 04/2021 ?Right: Normal size right kidney. Normal right Resisitive Index.  ?       Abnormal cortical thickness of right kidney. No evidence of  ?       right renal artery stenosis. RRV flow present.  ?Left:  LRV flow present. No evidence of left renal artery stenosis.  ?       Normal size of left kidney. Normal left Resistive Index.  ?       Abnormal cortical thickness of the left kidney.  ?Mesenteric:  ?Normal Celiac artery and Superior  Mesenteric artery findings.  ?Ectatic aorta.  ?  ? ?EKG:  EKG is ordered today.  The ekg ordered today demonstrates  NSR, PVC, 76bpm,  nonspecific T wave changes  ? ?Recent Labs: ?02/04/2021: ALT 20 ?03/20/2021: Hemoglobin 10.8; Platelets 367 ?07/05/2021: BUN 36; Creatinine, Ser 3.20; Potassium 4.8; Sodium 136  ?Recent Lipid Panel ?   ?Component Value Date/Time  ? CHOL 281 (H) 02/05/2021 0552  ? TRIG 442 (H) 02/05/2021 0552  ? HDL 47 02/05/2021 0552  ? CHOLHDL 6.0 02/05/2021 0552  ? VLDL UNABLE TO CALCULATE IF TRIGLYCERIDE OVER 400 mg/dL 02/05/2021 0552  ? LDLCALC UNABLE TO CALCULATE IF TRIGLYCERIDE OVER  400 mg/dL 02/05/2021 0552  ? LDLDIRECT 152.5 (H) 02/05/2021 0721  ? ? ?Physical Exam:   ? ?VS:  BP (!) 160/90 (BP Location: Left Arm, Patient Position: Sitting, Cuff Size: Normal) Comment: after ekg  Pulse 76   Ht _0  (1.6 m)   Wt 237 lb 2 oz (107.6 kg)   SpO2 98%   BMI 42.00 kg/m?    ? ?Wt Readings from Last 3 Encounters:  ?07/21/21 237 lb 2 oz (107.6 kg)  ?06/23/21 246 lb 6.4 oz (111.8 kg)  ?06/19/21 246 lb (111.6 kg)  ?  ? ?GEN:  Well nourished, well developed in no acute distress ?HEENT: Normal ?NECK: No JVD; No carotid bruits ?LYMPHATICS: No lymphadenopathy ?CARDIAC: RRR, no murmurs, rubs, gallops ?RESPIRATORY:  Clear to auscultation without rales, wheezing or rhonchi  ?ABDOMEN: Soft, non-tender, non-distended ?MUSCULOSKELETAL:  lower leg edema; No deformity  ?SKIN: Warm and dry ?NEUROLOGIC:  Alert and oriented x 3 ?PSYCHIATRIC:  Normal affect  ? ?ASSESSMENT:   ? ?1. Leg swelling   ?2. Hypertension, unspecified type   ?3. Stage 3 chronic kidney disease, unspecified whether stage 3a or 3b CKD ([place])   ?4. Morbid obesity ([place])   ?5. OSA (obstructive sleep apnea)   ?6. Uncontrolled hypertension   ?7. Acute kidney injury superimposed on chronic kidney disease ([place])   ?8. Chronic diastolic heart failure ([place])   ? ?PLAN:   ? ?In order of problems listed above: ?LLE ?Diastolic Dysfunction ?LLE is better, but still  not completely resolved. Kidney function was still abnormal on lower Torsemide dose of 65m daily, so I will stop Torsemide. BMET today. I will stop amlodipine for LLE. He is also on Losartan 296mdaily, which

## 2021-07-21 NOTE — Patient Instructions (Signed)
Medication Instructions:  ?Your physician has recommended you make the following change in your medication:  ? ?STOP taking amlodipine  ? ?STOP taking torsemide  ? ?INCREASE carvedilol (Coreg) to 25 mg twice daily  ? ?INCREASE hydralazine to 100 mg three times daily  ? ?*If you need a refill on your cardiac medications before your next appointment, please call your pharmacy* ? ? ?Lab Work: ? ?Today: BMET ? ?If you have labs (blood work) drawn today and your tests are completely normal, you will receive your results only by: ?MyChart Message (if you have MyChart) OR ?A paper copy in the mail ?If you have any lab test that is abnormal or we need to change your treatment, we will call you to review the results. ? ? ?Testing/Procedures: ?None ordered ? ? ?Follow-Up: ?At [facility], you and your health needs are our priority.  As part of our continuing mission to provide you with exceptional heart care, we have created designated Provider Care Teams.  These Care Teams include your primary Cardiologist (physician) and Advanced Practice Providers (APPs -  Physician Assistants and Nurse Practitioners) who all work together to provide you with the care you need, when you need it. ? ?We recommend signing up for the patient portal called "MyChart".  Sign up information is provided on this After Visit Summary.  MyChart is used to connect with patients for Virtual Visits (Telemedicine).  Patients are able to view lab/test results, encounter notes, upcoming appointments, etc.  Non-urgent messages can be sent to your provider as well.   ?To learn more about what you can do with MyChart, go to [email or web address].   ? ?Your next appointment:   ?1 month(s) ? ?The format for your next appointment:   ?In Person ? ?Provider:   ?You may see Nelva Bush, MD or one of the following Advanced Practice Providers on your designated Care Team:   ?Murray Hodgkins, NP ?Christell Faith, PA-C ?Cadence Kathlen Mody, PA-C ? ? ?Other  Instructions ?N/A ?

## 2021-07-22 LAB — BASIC METABOLIC PANEL
BUN/Creatinine Ratio: 11 (ref 9–20)
BUN: 34 mg/dL — ABNORMAL HIGH (ref 6–24)
CO2: 20 mmol/L (ref 20–29)
Calcium: 9.7 mg/dL (ref 8.7–10.2)
Chloride: 102 mmol/L (ref 96–106)
Creatinine, Ser: 3.14 mg/dL — ABNORMAL HIGH (ref 0.76–1.27)
Glucose: 99 mg/dL (ref 70–99)
Potassium: 4.1 mmol/L (ref 3.5–5.2)
Sodium: 135 mmol/L (ref 134–144)
eGFR: 24 mL/min/{1.73_m2} — ABNORMAL LOW (ref >59)

## 2021-07-24 ENCOUNTER — Other Ambulatory Visit: Payer: Self-pay | Admitting: Endocrinology

## 2021-07-25 ENCOUNTER — Other Ambulatory Visit: Payer: Self-pay | Admitting: Endocrinology

## 2021-07-25 MED ORDER — OZEMPIC (0.25 OR 0.5 MG/DOSE) 2 MG/3ML ~~LOC~~ SOPN: 0.2500 mg | PEN_INJECTOR | SUBCUTANEOUS | 3 refills | Status: DC

## 2021-07-26 ENCOUNTER — Other Ambulatory Visit (HOSPITAL_COMMUNITY): Payer: Self-pay

## 2021-07-26 ENCOUNTER — Telehealth: Payer: Self-pay

## 2021-07-26 NOTE — Telephone Encounter (Signed)
Patient Advocate Encounter ? ?Prior Authorization for Ozempic 2mg /4ml pen injectors has been approved.   ? ?PA# [id number] ? ?Effective dates: 06/26/21 through 07/26/22 ? ?Too soon to fill. ? ?Patient Advocate ?Fax:  [phone]  ?

## 2021-07-28 ENCOUNTER — Ambulatory Visit: Payer: 59 | Admitting: Endocrinology

## 2021-07-28 ENCOUNTER — Encounter: Payer: Self-pay | Admitting: Endocrinology

## 2021-07-28 VITALS — BP 186/110 | HR 84 | Ht 63.0 in | Wt 239.0 lb

## 2021-07-28 DIAGNOSIS — N184 Chronic kidney disease, stage 4 (severe): Secondary | ICD-10-CM | POA: Diagnosis not present

## 2021-07-28 DIAGNOSIS — E1122 Type 2 diabetes mellitus with diabetic chronic kidney disease: Secondary | ICD-10-CM | POA: Diagnosis not present

## 2021-07-28 DIAGNOSIS — Z794 Long term (current) use of insulin: Secondary | ICD-10-CM

## 2021-07-28 MED ORDER — OZEMPIC (0.25 OR 0.5 MG/DOSE) 2 MG/3ML ~~LOC~~ SOPN: 0.5000 mg | PEN_INJECTOR | SUBCUTANEOUS | 3 refills | Status: DC

## 2021-07-28 NOTE — Patient Instructions (Addendum)
check your blood sugar twice a day.  vary the time of day when you check, between before the 3 meals, and at bedtime.  also check if you have symptoms of your blood sugar being too high or too low.  please keep a record of the readings and bring it to your next appointment here (or you can bring the meter itself).  You can write it on any piece of paper.  please call us sooner if your blood sugar goes below 70, or if most of your readings are over 200.   ?I have sent a prescription to your pharmacy, to increase the Ozempic.   ?Please continue the same Lantus.    ?Please come back for a follow-up appointment in 1 month.   ?

## 2021-07-28 NOTE — Progress Notes (Signed)
? ?Subjective:  ? ? Patient ID: David Klein, male    DOB: [birth year], 51 y.o.   MRN: [id number] ? ?HPI ?Pt returns for f/u of diabetes mellitus:  ?DM type: Insulin-requiring type 2 ?Dx'ed: 2002 ?Complications: stage 4 CRF ?Therapy: insulin intermittently since 2018, and consistently since 2022, and Ozempic.   ?DKA: never ?Severe hypoglycemia: never ?Pancreatitis: never ?Pancreatic imaging: none known ?SDOH: none ?Other: he takes qd insulin, at least for now; goal is to d/c insulin if possible.   ?Interval history: no cbg record, but states cbg's vary from 130-300.  It is in general highest at [place].    ?Past Medical History:  ?Diagnosis Date  ? Allergy   ? Hyperlipidemia   ? Hypertension   ? ? ?Past Surgical History:  ?Procedure Laterality Date  ? TOOTH EXTRACTION    ? WISDOM TOOTH EXTRACTION Bilateral   ? ? ?Social History  ? ?Socioeconomic History  ? Marital status: Single  ?  Spouse name: Not on file  ? Number of children: 0  ? Years of education: 48  ? Highest education level: Master's degree (e.g., MA, MS, MEng, MEd, MSW, MBA)  ?Occupational History  ? Not on file  ?Tobacco Use  ? Smoking status: Some Days  ?  Types: Cigars  ? Smokeless tobacco: Never  ? Tobacco comments:  ?  1 cigar every few months.  ?Vaping Use  ? Vaping Use: Never used  ?Substance and Sexual Activity  ? Alcohol use: Yes  ?  Comment: Few drinks every few months.  ? Drug use: Never  ? Sexual activity: Not Currently  ?  Partners: Female  ?Other Topics Concern  ? Not on file  ?Social History Narrative  ? Not on file  ? ?Social Determinants of Health  ? ?Financial Resource Strain: Not on file  ?Food Insecurity: Not on file  ?Transportation Needs: Not on file  ?Physical Activity: Not on file  ?Stress: Not on file  ?Social Connections: Not on file  ?Intimate Partner Violence: Not on file  ? ? ?Current Outpatient Medications on File Prior to Visit  ?Medication Sig Dispense Refill  ? aspirin 81 MG EC tablet Take 1 tablet (81 mg total) by mouth  daily. 30 tablet 12  ? blood glucose meter kit and supplies KIT Dispense based on patient and insurance preference. Use up to four times daily as directed. 1 each 0  ? carvedilol (COREG) 25 MG tablet Take 1 tablet (25 mg total) by mouth 2 (two) times daily. 60 tablet 2  ? Continuous Blood Gluc Sensor (FREESTYLE LIBRE 2 SENSOR) MISC 1 Device by Does not apply route every 14 (fourteen) days. 6 each 3  ? diphenhydrAMINE HCl (BENADRYL ALLERGY PO) Take by mouth 2 (two) times daily as needed.    ? hydrALAZINE (APRESOLINE) 100 MG tablet Take 1 tablet (100 mg total) by mouth 3 (three) times daily. 270 tablet 3  ? insulin glargine (LANTUS SOLOSTAR) 100 UNIT/ML Solostar Pen Inject 40 Units into the skin every morning. And pen needles 1/day. 45 mL PRN  ? Insulin Pen Needle (PEN NEEDLES) 31G X 5 MM MISC 15 Units by Does not apply route at bedtime. 100 each 1  ? losartan (COZAAR) 25 MG tablet Take 1 tablet (25 mg total) by mouth daily. 90 tablet 0  ? rosuvastatin (CRESTOR) 40 MG tablet Take 1 tablet (40 mg total) by mouth daily. 90 tablet 1  ? triamcinolone (NASACORT) 55 MCG/ACT AERO nasal inhaler 2 sprays daily.    ? ?  No current facility-administered medications on file prior to visit.  ? ? ?No Known Allergies ? ?Family History  ?Problem Relation Age of Onset  ? Hypertension Mother   ? Hypertension Father   ? Lung cancer Father   ? Diabetes Sister   ? Hypertension Sister   ? Diabetes Sister   ? Hypertension Sister   ? Hypertension Brother   ? Stroke Paternal Grandmother   ? ? ?BP (!) 186/110 (BP Location: Left Arm, Patient Position: Sitting, Cuff Size: Normal)   Pulse 84   Ht $R'5\' 3"'hm$  (1.6 m)   Wt 239 lb (108.4 kg)   SpO2 96%   BMI 42.34 kg/m?  ? ? ?Review of Systems ?N/V have resolved.  ?   ?Objective:  ? Physical Exam ?VITAL SIGNS:  See vs page.  ?GENERAL: no distress.   ? ? ? ?Lab Results  ?Component Value Date  ? HGBA1C 8.0 (A) 06/09/2021  ? ?   ?Assessment & Plan:  ?Insulin-requiring type 2 DM: uncontrolled ? ?Patient  Instructions  ?check your blood sugar twice a day.  vary the time of day when you check, between before the 3 meals, and at bedtime.  also check if you have symptoms of your blood sugar being too high or too low.  please keep a record of the readings and bring it to your next appointment here (or you can bring the meter itself).  You can write it on any piece of paper.  please call us sooner if your blood sugar goes below 70, or if most of your readings are over 200.   ?I have sent a prescription to your pharmacy, to increase the Ozempic.   ?Please continue the same Lantus.    ?Please come back for a follow-up appointment in 1 month.   ? ? ?

## 2021-08-14 ENCOUNTER — Ambulatory Visit (INDEPENDENT_AMBULATORY_CARE_PROVIDER_SITE_OTHER): Payer: 59 | Admitting: Family Medicine

## 2021-08-14 ENCOUNTER — Encounter: Payer: Self-pay | Admitting: Family Medicine

## 2021-08-14 VITALS — BP 160/102 | HR 71 | Ht 64.0 in | Wt 237.0 lb

## 2021-08-14 DIAGNOSIS — Z794 Long term (current) use of insulin: Secondary | ICD-10-CM | POA: Diagnosis not present

## 2021-08-14 DIAGNOSIS — E785 Hyperlipidemia, unspecified: Secondary | ICD-10-CM | POA: Diagnosis not present

## 2021-08-14 DIAGNOSIS — E1122 Type 2 diabetes mellitus with diabetic chronic kidney disease: Secondary | ICD-10-CM | POA: Diagnosis not present

## 2021-08-14 DIAGNOSIS — N184 Chronic kidney disease, stage 4 (severe): Secondary | ICD-10-CM | POA: Diagnosis not present

## 2021-08-14 DIAGNOSIS — I10 Essential (primary) hypertension: Secondary | ICD-10-CM

## 2021-08-14 MED ORDER — ROSUVASTATIN CALCIUM 40 MG PO TABS: 40.0000 mg | ORAL_TABLET | Freq: Every day | ORAL | 1 refills | Status: DC

## 2021-08-14 NOTE — Assessment & Plan Note (Signed)
Following with endocrinology, he has been started and titrated on Ozempic, now at 50 mg weekly dose. This has allowed reduction in Lantus. We will continue to follow peripherally. ?

## 2021-08-14 NOTE — Assessment & Plan Note (Signed)
Chronic condition, asymptomatic from a cardiopulmonary standpoint and his examination is consistent with the same. He is following with cardiology and care course somewhat complicated by comorbid renal concerns. We will follow peripherally on this. He did establish with local ENT/Sleep Medicine but is awaiting new CPAP. ?

## 2021-08-14 NOTE — Assessment & Plan Note (Signed)
In the setting of hypertension, DM2. Crestor 40 mg refilled. ?

## 2021-08-14 NOTE — Progress Notes (Signed)
?  ? ?  Primary Care / Sports Medicine Office Visit ? ?Patient Information:  ?Patient ID: David Klein, male DOB: May 19, 1970 Age: 51 y.o. MRN: [id number]  ? ?David Klein is a pleasant 51 y.o. male presenting with the following: ? ?Chief Complaint  ?Patient presents with  ? Follow-up  ?  Pt here for F/U, states hasn't got his CPAP yet but will follow up with ENT to check on that. Pt states everything else is going fine. BP is high, cardiologist just changed his medications.   ? ? ?Vitals:  ? 08/14/21 1022  ?BP: (!) 160/102  ?Pulse: 71  ?SpO2: 97%  ? ?Vitals:  ? 08/14/21 1022  ?Weight: 237 lb (107.5 kg)  ?Height: 5\' 4"  (1.626 m)  ? ?Body mass index is 40.68 kg/m?. ? ?No results found.  ? ?Independent interpretation of notes and tests performed by another provider:  ? ?None ? ?Procedures performed:  ? ?None ? ?Pertinent History, Exam, Impression, and Recommendations:  ? ?Hypertension ?Chronic condition, asymptomatic from a cardiopulmonary standpoint and his examination is consistent with the same. He is following with cardiology and care course somewhat complicated by comorbid renal concerns. We will follow peripherally on this. He did establish with local ENT/Sleep Medicine but is awaiting new CPAP. ? ?Hyperlipidemia ?In the setting of hypertension, DM2. Crestor 40 mg refilled. ? ?Type 2 diabetes mellitus with chronic kidney disease, with long-term current use of insulin ([place]) ?Following with endocrinology, he has been started and titrated on Ozempic, now at 50 mg weekly dose. This has allowed reduction in Lantus. We will continue to follow peripherally.  ? ?I provided a total time of 34 minutes including both face-to-face and non-face-to-face time on 08/14/2021 inclusive of time utilized for medical chart review, information gathering, care coordination with staff, and documentation completion. Specifically record review of specialist visits, conveying of that information to patient with overarching medical  plan details. ? ? ?Orders & Medications ?Meds ordered this encounter  ?Medications  ? rosuvastatin (CRESTOR) 40 MG tablet  ?  Sig: Take 1 tablet (40 mg total) by mouth daily.  ?  Dispense:  90 tablet  ?  Refill:  1  ? ?No orders of the defined types were placed in this encounter. ?  ? ?Return in about 3 months (around 11/13/2021).  ?  ? ?Montel Culver, MD ? ? Primary Care Sports Medicine ?[facility] ?[place]  ? ?

## 2021-08-15 ENCOUNTER — Other Ambulatory Visit: Payer: Self-pay | Admitting: Family Medicine

## 2021-08-15 DIAGNOSIS — I10 Essential (primary) hypertension: Secondary | ICD-10-CM

## 2021-08-15 NOTE — Telephone Encounter (Signed)
Requested Prescriptions  ?Pending Prescriptions Disp Refills  ?? losartan (COZAAR) 25 MG tablet [Pharmacy Med Name: LOSARTAN POTASSIUM 25 MG TAB] 90 tablet 0  ?  Sig: TAKE 1 TABLET (25 MG TOTAL) BY MOUTH DAILY.  ?  ? Cardiovascular:  Angiotensin Receptor Blockers Failed - 08/15/2021  2:39 AM  ?  ?  Failed - Cr in normal range and within 180 days  ?  Creatinine, Ser  ?Date Value Ref Range Status  ?07/21/2021 3.14 (H) 0.76 - 1.27 mg/dL Final  ? ?Creatinine, Urine  ?Date Value Ref Range Status  ?02/06/2021 73 mg/dL Final  ?   ?  ?  Failed - Last BP in normal range  ?  BP Readings from Last 1 Encounters:  ?08/14/21 (!) 160/102  ?   ?  ?  Passed - K in normal range and within 180 days  ?  Potassium  ?Date Value Ref Range Status  ?07/21/2021 4.1 3.5 - 5.2 mmol/L Final  ?   ?  ?  Passed - Patient is not pregnant  ?  ?  Passed - Valid encounter within last 6 months  ?  Recent Outpatient Visits   ?      ? Yesterday Hypertension, unspecified type  ? [facility] Montel Culver, MD  ? 3 months ago Type 2 diabetes mellitus with hyperosmolar hyperglycemic state (HHS) [facility])  ? [place], [place]  ? 5 months ago Hypertension, unspecified type  ? [facility] Montel Culver, MD  ?  ?  ?Future Appointments   ?        ? In 1 week [name], [employer], PA-C [place]  ? In 3 months Zigmund Daniel Earley Abide, MD [facility], [place]  ?  ? ?  ?  ?  ? ? ?

## 2021-08-23 DIAGNOSIS — G4733 Obstructive sleep apnea (adult) (pediatric): Secondary | ICD-10-CM | POA: Diagnosis not present

## 2021-08-24 DIAGNOSIS — G4733 Obstructive sleep apnea (adult) (pediatric): Secondary | ICD-10-CM | POA: Diagnosis not present

## 2021-08-25 ENCOUNTER — Ambulatory Visit: Payer: 59 | Admitting: Medical

## 2021-09-08 ENCOUNTER — Ambulatory Visit: Payer: 59 | Admitting: Endocrinology

## 2021-09-14 ENCOUNTER — Other Ambulatory Visit: Payer: Self-pay | Admitting: Medical

## 2021-09-14 DIAGNOSIS — I10 Essential (primary) hypertension: Secondary | ICD-10-CM

## 2021-09-22 ENCOUNTER — Encounter: Payer: Self-pay | Admitting: Medical

## 2021-09-22 ENCOUNTER — Ambulatory Visit: Payer: 59 | Admitting: Medical

## 2021-09-22 VITALS — BP 166/88 | HR 79 | Ht 60.0 in | Wt 237.8 lb

## 2021-09-22 DIAGNOSIS — M7989 Other specified soft tissue disorders: Secondary | ICD-10-CM | POA: Diagnosis not present

## 2021-09-22 DIAGNOSIS — G4733 Obstructive sleep apnea (adult) (pediatric): Secondary | ICD-10-CM | POA: Diagnosis not present

## 2021-09-22 DIAGNOSIS — I5032 Chronic diastolic (congestive) heart failure: Secondary | ICD-10-CM | POA: Diagnosis not present

## 2021-09-22 DIAGNOSIS — N183 Chronic kidney disease, stage 3 unspecified: Secondary | ICD-10-CM

## 2021-09-22 DIAGNOSIS — I10 Essential (primary) hypertension: Secondary | ICD-10-CM

## 2021-09-22 MED ORDER — ISOSORBIDE MONONITRATE ER 30 MG PO TB24: 30.0000 mg | ORAL_TABLET | Freq: Every day | ORAL | 3 refills | Status: DC

## 2021-09-22 NOTE — Progress Notes (Signed)
Cardiology Office Note:    Date:  09/22/2021   ID:  David Klein, DOB 1970-05-31, MRN [id number]  PCP:  David Culver, MD  David Klein:  Dr. Chuck [facility] Electrophysiologist:  None   Referring MD: David Culver, MD   Chief Complaint: 1 month follow-up  History of Present Illness:    David Klein is a 51 y.o. male with a hx of HFpEF, HTN, DM2, OSA, CKD stage 3 who presents for 1 month follow-up.    Hospitalization in 2022 with hypertensive urgency and hyperosmolar hyperglycemic state complicated by AKI on CKD. Referred to cardiology for BP control.    Patient was seen 04/12/21 and reported intermittent SOB and LLE on torsemide. BP was elevated and heart rate was high. Coreg was added. Echo and renal US were ordered. Echo showed normal LVEF, mild LVH and G2DD. US renal arteries showed no stenosis.    Last seen 06/19/21 and BP was very high. Amlodipine was decreased for LLE. Torsemide 25m was restarted. Hydralazine was restarted and Coreg was increased.    Labs came back with elevated kidney function and Torsemide was decreased to 262mdaily. Follow-up labs were better but not back to baseline.   Last seen 07/21/21 and reported improved swelling, but still present. Amlodipine was stopped. BP was high. Coreg and hydralazine were increased.   Today, the patient reports swelling is much better off the amlodipine. He has decreased sodium intake, which does affect the swelling. BP is still high, diastolic is better. BP at home is also generally higher. He received his CPAP and is using this. Labs reviewed.   Past Medical History:  Diagnosis Date   Allergy    Diabetes mellitus without complication (H[facility]equim   Hyperlipidemia    Hypertension     Past Surgical History:  Procedure Laterality Date   TOOTH EXTRACTION     WISDOM TOOTH EXTRACTION Bilateral     Current Medications: Current Meds  Medication Sig   aspirin 81 MG EC tablet Take 1 tablet (81  mg total) by mouth daily.   blood glucose meter kit and supplies KIT Dispense based on patient and insurance preference. Use up to four times daily as directed. (Patient taking differently: Inject 1 each into the skin See admin instructions. Dispense based on patient and insurance preference. Use up to four times daily as directed.)   carvedilol (COREG) 25 MG tablet Take 1 tablet (25 mg total) by mouth 2 (two) times daily.   Continuous Blood Gluc Sensor (FREESTYLE LIBRE 2 SENSOR) MISC 1 Device by Does not apply route every 14 (fourteen) days.   diphenhydrAMINE HCl (BENADRYL ALLERGY PO) Take 1 tablet by mouth 2 (two) times daily as needed (allergies).   hydrALAZINE (APRESOLINE) 100 MG tablet Take 1 tablet (100 mg total) by mouth 3 (three) times daily.   insulin glargine (LANTUS SOLOSTAR) 100 UNIT/ML Solostar Pen Inject 40 Units into the skin every morning. And pen needles 1/day.   Insulin Pen Needle (PEN NEEDLES) 31G X 5 MM MISC 15 Units by Does not apply route at bedtime.   isosorbide mononitrate (IMDUR) 30 MG 24 hr tablet Take 1 tablet (30 mg total) by mouth daily.   losartan (COZAAR) 25 MG tablet TAKE 1 TABLET (25 MG TOTAL) BY MOUTH DAILY.   rosuvastatin (CRESTOR) 40 MG tablet Take 1 tablet (40 mg total) by mouth daily.   Semaglutide,0.25 or 0.5MG/DOS, (OZEMPIC, 0.25 OR 0.5 MG/DOSE,) 2 MG/3ML SOPN Inject 0.5 mg into the  skin once a week.   triamcinolone (NASACORT) 55 MCG/ACT AERO nasal inhaler Place 2 sprays into the nose daily.     Allergies:   Patient has no known allergies.   Social History   Socioeconomic History   Marital status: Single    Spouse name: Not on file   Number of children: 0   Years of education: 49   Highest education level: Master's degree (e.g., MA, MS, MEng, MEd, MSW, MBA)  Occupational History   Not on file  Tobacco Use   Smoking status: Former    Types: Cigars   Smokeless tobacco: Never   Tobacco comments:    1 cigar every few months.  Vaping Use   Vaping  Use: Never used  Substance and Sexual Activity   Alcohol use: Yes    Comment: Few drinks every few months.   Drug use: Never   Sexual activity: Not Currently    Partners: Female  Other Topics Concern   Not on file  Social History Narrative   Not on file   Social Determinants of Health   Financial Resource Strain: Not on file  Food Insecurity: Not on file  Transportation Needs: Not on file  Physical Activity: Not on file  Stress: Not on file  Social Connections: Not on file     Family History: The patient's family history includes Diabetes in his sister and sister; Hypertension in his brother, father, mother, sister, and sister; Lung cancer in his father; Stroke in his paternal grandmother.  ROS:   Please see the history of present illness.     All other systems reviewed and are negative.  EKGs/Labs/Other Studies Reviewed:    The following studies were reviewed today:  Echo 04/2021  1. Left ventricular ejection fraction, by estimation, is 55 to 60%. The  left ventricle has normal function. The left ventricle has no regional  wall motion abnormalities. There is mild to moderate left ventricular  hypertrophy. Left ventricular diastolic  parameters are consistent with Grade II diastolic dysfunction  (pseudonormalization).   2. Right ventricular systolic function is normal. The right ventricular  size is normal. Tricuspid regurgitation signal is inadequate for assessing  PA pressure.   3. Left atrial size was mildly dilated.   4. The mitral valve is normal in structure. Mild mitral valve  regurgitation. No evidence of mitral stenosis.   5. The aortic valve was not well visualized. Aortic valve regurgitation  is not visualized. No aortic stenosis is present.   6. The inferior vena cava is normal in size with greater than 50%  respiratory variability, suggesting right atrial pressure of 3 mmHg.    Renal US 04/2021 Right: Normal size right kidney. Normal right Resisitive  Index.         Abnormal cortical thickness of right kidney. No evidence of         right renal artery stenosis. RRV flow present.  Left:  LRV flow present. No evidence of left renal artery stenosis.         Normal size of left kidney. Normal left Resistive Index.         Abnormal cortical thickness of the left kidney.  Mesenteric:  Normal Celiac artery and Superior Mesenteric artery findings.  Ectatic aorta.     EKG:  EKG is  ordered today.  The ekg ordered today demonstrates NSR 77bpm, nonspecific T wave changes  Recent Labs: 02/04/2021: ALT 20 03/20/2021: Hemoglobin 10.8; Platelets 367 07/21/2021: BUN 34; Creatinine, Ser 3.14; Potassium 4.1;  Sodium 135  Recent Lipid Panel    Component Value Date/Time   CHOL 281 (H) 02/05/2021 0552   TRIG 442 (H) 02/05/2021 0552   HDL 47 02/05/2021 0552   CHOLHDL 6.0 02/05/2021 0552   VLDL UNABLE TO CALCULATE IF TRIGLYCERIDE OVER 400 mg/dL 02/05/2021 0552   LDLCALC UNABLE TO CALCULATE IF TRIGLYCERIDE OVER 400 mg/dL 02/05/2021 0552   LDLDIRECT 152.5 (H) 02/05/2021 0552     Physical Exam:    VS:  BP (!) 166/88 (BP Location: Left Arm, Patient Position: Sitting)   Pulse 79   Ht 5' (1.524 m)   Wt 237 lb 12.8 oz (107.9 kg)   HC 4" (10.2 cm)   SpO2 95%   BMI 46.44 kg/m     Wt Readings from Last 3 Encounters:  09/22/21 237 lb 12.8 oz (107.9 kg)  08/14/21 237 lb (107.5 kg)  07/28/21 239 lb (108.4 kg)     GEN:  Well nourished, well developed in no acute distress HEENT: Normal NECK: No JVD; No carotid bruits LYMPHATICS: No lymphadenopathy CARDIAC: RRR, no murmurs, rubs, gallops RESPIRATORY:  Clear to auscultation without rales, wheezing or rhonchi  ABDOMEN: Soft, non-tender, non-distended MUSCULOSKELETAL:  Trace lower leg edema; No deformity  SKIN: Warm and dry NEUROLOGIC:  Alert and oriented x 3 PSYCHIATRIC:  Normal affect   ASSESSMENT:    1. Leg swelling   2. Chronic diastolic heart failure ([place])   3. Uncontrolled hypertension    4. Stage 3 chronic kidney disease, unspecified whether stage 3a or 3b CKD ([place])   5. OSA (obstructive sleep apnea)    PLAN:    In order of problems listed above:  LLE  HFpEF LLE improved off amlodipine and low salt intake. Has trace lower leg edema on exam resolves overnight. Continue Coreg, Hydralazine, and Losartan. He is off Torsemide due to worsening kidney function. May consider SGLT2i at follow-up.   Uncontrolled HTN BP is still high, 166/88. He is on coreg 76m BID, Hydralazine 1052mTID and Losartan 2515maily. I am hesitant to increase Losartan due to CKD. I will add Imdur 62m57mily. We will see him back in a few weeks to assess BP. Can consider hypertensive clinic at follow-up  CKD stage 3-4 Labs from March Scr 3.14, BUN 34, which is stable. He follows with nephrology.   Obesity Weight loss recommended.   OSA He has started using CPAP.   Disposition: Follow up in 3 week(s) with APP     Signed, Lynae Pederson H FuNinfa Meeker-C  09/22/2021 2:38 PM    [facility]

## 2021-09-22 NOTE — Patient Instructions (Signed)
Medication Instructions:  Your physician has recommended you make the following change in your medication: 1) START taking Imdur (isosorbide mononitrate) 30 mg daily  *If you need a refill on your cardiac medications before your next appointment, please call your pharmacy*  Follow-Up: At [facility], you and your health needs are our priority.  As part of our continuing mission to provide you with exceptional heart care, we have created designated Provider Care Teams.  These Care Teams include your primary Cardiologist (physician) and Advanced Practice Providers (APPs -  Physician Assistants and Nurse Practitioners) who all work together to provide you with the care you need, when you need it.   Your next appointment:   3 week(s)  The format for your next appointment:   In Person  Provider:   Cadence Kathlen Mody, PA-C   Important Information About Sugar

## 2021-09-23 DIAGNOSIS — G4733 Obstructive sleep apnea (adult) (pediatric): Secondary | ICD-10-CM | POA: Diagnosis not present

## 2021-10-02 DIAGNOSIS — E1169 Type 2 diabetes mellitus with other specified complication: Secondary | ICD-10-CM | POA: Insufficient documentation

## 2021-10-02 DIAGNOSIS — E1122 Type 2 diabetes mellitus with diabetic chronic kidney disease: Secondary | ICD-10-CM | POA: Diagnosis not present

## 2021-10-02 DIAGNOSIS — N179 Acute kidney failure, unspecified: Secondary | ICD-10-CM | POA: Diagnosis not present

## 2021-10-02 DIAGNOSIS — I10 Essential (primary) hypertension: Secondary | ICD-10-CM | POA: Diagnosis not present

## 2021-10-02 DIAGNOSIS — N1831 Chronic kidney disease, stage 3a: Secondary | ICD-10-CM | POA: Diagnosis not present

## 2021-10-02 DIAGNOSIS — E871 Hypo-osmolality and hyponatremia: Secondary | ICD-10-CM | POA: Insufficient documentation

## 2021-10-02 DIAGNOSIS — E1100 Type 2 diabetes mellitus with hyperosmolarity without nonketotic hyperglycemic-hyperosmolar coma (NKHHC): Secondary | ICD-10-CM | POA: Diagnosis not present

## 2021-10-02 DIAGNOSIS — E785 Hyperlipidemia, unspecified: Secondary | ICD-10-CM | POA: Insufficient documentation

## 2021-10-02 LAB — HEMOGLOBIN A1C: Hemoglobin A1C: 7.5

## 2021-10-04 ENCOUNTER — Other Ambulatory Visit: Payer: Self-pay | Admitting: Family Medicine

## 2021-10-04 DIAGNOSIS — I10 Essential (primary) hypertension: Secondary | ICD-10-CM

## 2021-10-04 DIAGNOSIS — G4733 Obstructive sleep apnea (adult) (pediatric): Secondary | ICD-10-CM | POA: Diagnosis not present

## 2021-10-04 NOTE — Telephone Encounter (Signed)
Requested medications are due for refill today.  yes  Requested medications are on the active medications list.  yes  Last refill. 08/15/2021 #90 0 refills  Future visit scheduled.   yes  Notes to clinic.  Medication refill failed protocol d/t abnormal labs.    Requested Prescriptions  Pending Prescriptions Disp Refills   losartan (COZAAR) 25 MG tablet [Pharmacy Med Name: LOSARTAN POTASSIUM 25 MG TAB] 30 tablet 2    Sig: TAKE 1 TABLET (25 MG TOTAL) BY MOUTH DAILY.     Cardiovascular:  Angiotensin Receptor Blockers Failed - 10/04/2021  8:32 AM      Failed - Cr in normal range and within 180 days    Creatinine, Ser  Date Value Ref Range Status  07/21/2021 3.14 (H) 0.76 - 1.27 mg/dL Final   Creatinine, Urine  Date Value Ref Range Status  02/06/2021 73 mg/dL Final         Failed - Last BP in normal range    BP Readings from Last 1 Encounters:  09/22/21 (!) 166/88         Passed - K in normal range and within 180 days    Potassium  Date Value Ref Range Status  07/21/2021 4.1 3.5 - 5.2 mmol/L Final         Passed - Patient is not pregnant      Passed - Valid encounter within last 6 months    Recent Outpatient Visits           1 month ago Hypertension, unspecified type   [facility] Montel Culver, MD   4 months ago Type 2 diabetes mellitus with hyperosmolar hyperglycemic state (HHS) [facility])   [facility] Montel Culver, MD   7 months ago Hypertension, unspecified type   [place], MD       Future Appointments             In 1 week [name], [place], PA-C [place], [facility]   In 1 month Zigmund Daniel, Earley Abide, MD [facility], [place]

## 2021-10-13 ENCOUNTER — Ambulatory Visit: Payer: 59 | Admitting: Medical

## 2021-10-17 ENCOUNTER — Other Ambulatory Visit: Payer: Self-pay | Admitting: Medical

## 2021-10-17 DIAGNOSIS — I10 Essential (primary) hypertension: Secondary | ICD-10-CM

## 2021-10-23 DIAGNOSIS — G4733 Obstructive sleep apnea (adult) (pediatric): Secondary | ICD-10-CM | POA: Diagnosis not present

## 2021-11-13 ENCOUNTER — Ambulatory Visit: Payer: 59 | Admitting: Family Medicine

## 2021-11-20 ENCOUNTER — Other Ambulatory Visit: Payer: Self-pay | Admitting: Family Medicine

## 2021-11-20 DIAGNOSIS — I10 Essential (primary) hypertension: Secondary | ICD-10-CM

## 2021-11-21 DIAGNOSIS — G4733 Obstructive sleep apnea (adult) (pediatric): Secondary | ICD-10-CM | POA: Diagnosis not present

## 2021-11-22 DIAGNOSIS — G4733 Obstructive sleep apnea (adult) (pediatric): Secondary | ICD-10-CM | POA: Diagnosis not present

## 2021-11-22 NOTE — Telephone Encounter (Signed)
Requested Prescriptions  Pending Prescriptions Disp Refills  . losartan (COZAAR) 25 MG tablet [Pharmacy Med Name: LOSARTAN POTASSIUM 25 MG TAB] 90 tablet 0    Sig: TAKE 1 TABLET (25 MG TOTAL) BY MOUTH DAILY.     Cardiovascular:  Angiotensin Receptor Blockers Failed - 11/20/2021 12:32 PM      Failed - Cr in normal range and within 180 days    Creatinine, Ser  Date Value Ref Range Status  07/21/2021 3.14 (H) 0.76 - 1.27 mg/dL Final   Creatinine, Urine  Date Value Ref Range Status  02/06/2021 73 mg/dL Final         Failed - Last BP in normal range    BP Readings from Last 1 Encounters:  09/22/21 (!) 166/88         Passed - K in normal range and within 180 days    Potassium  Date Value Ref Range Status  07/21/2021 4.1 3.5 - 5.2 mmol/L Final         Passed - Patient is not pregnant      Passed - Valid encounter within last 6 months    Recent Outpatient Visits          3 months ago Hypertension, unspecified type   [facility] Montel Culver, MD   6 months ago Type 2 diabetes mellitus with hyperosmolar hyperglycemic state (HHS) [facility])   [facility] Montel Culver, MD   8 months ago Hypertension, unspecified type   [place], MD      Future Appointments            In 2 days [name], Cadence H, PA-C [place], [facility]   In 2 weeks Zigmund Daniel, Earley Abide, MD [facility], [place]

## 2021-11-24 ENCOUNTER — Ambulatory Visit: Payer: 59 | Admitting: Medical

## 2021-11-24 ENCOUNTER — Encounter: Payer: Self-pay | Admitting: Medical

## 2021-11-24 VITALS — BP 128/84 | HR 82 | Ht 64.0 in | Wt 244.2 lb

## 2021-11-24 DIAGNOSIS — M7989 Other specified soft tissue disorders: Secondary | ICD-10-CM

## 2021-11-24 DIAGNOSIS — I10 Essential (primary) hypertension: Secondary | ICD-10-CM | POA: Diagnosis not present

## 2021-11-24 DIAGNOSIS — I5032 Chronic diastolic (congestive) heart failure: Secondary | ICD-10-CM

## 2021-11-24 DIAGNOSIS — N183 Chronic kidney disease, stage 3 unspecified: Secondary | ICD-10-CM

## 2021-11-24 DIAGNOSIS — G4733 Obstructive sleep apnea (adult) (pediatric): Secondary | ICD-10-CM

## 2021-11-24 NOTE — Patient Instructions (Signed)
Medication Instructions:  Your physician recommends that you continue on your current medications as directed. Please refer to the Current Medication list given to you today.   *If you need a refill on your cardiac medications before your next appointment, please call your pharmacy*   Lab Work: None ordered  If you have labs (blood work) drawn today and your tests are completely normal, you will receive your results only by: MyChart Message (if you have MyChart) OR A paper copy in the mail If you have any lab test that is abnormal or we need to change your treatment, we will call you to review the results.   Testing/Procedures: None ordered   Follow-Up: At [facility], you and your health needs are our priority.  As part of our continuing mission to provide you with exceptional heart care, we have created designated Provider Care Teams.  These Care Teams include your primary Cardiologist (physician) and Advanced Practice Providers (APPs -  Physician Assistants and Nurse Practitioners) who all work together to provide you with the care you need, when you need it.  We recommend signing up for the patient portal called "MyChart".  Sign up information is provided on this After Visit Summary.  MyChart is used to connect with patients for Virtual Visits (Telemedicine).  Patients are able to view lab/test results, encounter notes, upcoming appointments, etc.  Non-urgent messages can be sent to your provider as well.   To learn more about what you can do with MyChart, go to [email or web address].    Your next appointment:   3 month(s)  The format for your next appointment:   In Person  Provider:   You may see Christopher End, MD or one of the following Advanced Practice Providers on your designated Care Team:   Christopher Berge, NP Ryan Dunn, PA-C Cadence [name], PA-C   Other Instructions N/A  Important Information About Sugar       

## 2021-11-24 NOTE — Progress Notes (Signed)
Cardiology Office Note:    Date:  11/24/2021   ID:  David Klein, DOB 06/24/1970, MRN [id number]  PCP:  Montel Culver, MD  [facility] Cardiologist:  None  [facility] Electrophysiologist:  None   Referring MD: Montel Culver, MD   Chief Complaint: 3 week follow-up  History of Present Illness:    ABDON Klein is a 51 y.o. male with a hx of  HFpEF, HTN, DM2, OSA, CKD stage 3 who presents for 1 month follow-up.    Hospitalization in 2022 with hypertensive urgency and hyperosmolar hyperglycemic state complicated by AKI on CKD. Referred to cardiology for BP control.    Patient was seen 04/12/21 and reported intermittent SOB and LLE on torsemide. BP was elevated and heart rate was high. Coreg was added. Echo and renal US were ordered. Echo showed normal LVEF, mild LVH and G2DD. US renal arteries showed no stenosis.    Last seen 09/22/21 and reported resolved LLE off amlodipine. Imdur was added for elevated BP.   Today, BP is much better today. He says it's high at home, but the cuff may not be reading right. He denies chest pain or SOB. He has mild lower leg edema. He is working on low salt diet.   Past Medical History:  Diagnosis Date   Allergy    Diabetes mellitus without complication ([place])    Hyperlipidemia    Hypertension     Past Surgical History:  Procedure Laterality Date   TOOTH EXTRACTION     WISDOM TOOTH EXTRACTION Bilateral     Current Medications: Current Meds  Medication Sig   aspirin 81 MG EC tablet Take 1 tablet (81 mg total) by mouth daily.   blood glucose meter kit and supplies KIT Dispense based on patient and insurance preference. Use up to four times daily as directed. (Patient taking differently: Inject 1 each into the skin See admin instructions. Dispense based on patient and insurance preference. Use up to four times daily as directed.)   carvedilol (COREG) 25 MG tablet Take 1 tablet (25 mg total) by mouth 2 (two) times daily.    Continuous Blood Gluc Sensor (FREESTYLE LIBRE 2 SENSOR) MISC 1 Device by Does not apply route every 14 (fourteen) days.   diphenhydrAMINE HCl (BENADRYL ALLERGY PO) Take 1 tablet by mouth 2 (two) times daily as needed (allergies).   hydrALAZINE (APRESOLINE) 100 MG tablet Take 1 tablet (100 mg total) by mouth 3 (three) times daily.   insulin glargine (LANTUS SOLOSTAR) 100 UNIT/ML Solostar Pen Inject 40 Units into the skin every morning. And pen needles 1/day.   Insulin Pen Needle (PEN NEEDLES) 31G X 5 MM MISC 15 Units by Does not apply route at bedtime.   isosorbide mononitrate (IMDUR) 30 MG 24 hr tablet Take 1 tablet (30 mg total) by mouth daily.   losartan (COZAAR) 25 MG tablet TAKE 1 TABLET (25 MG TOTAL) BY MOUTH DAILY.   rosuvastatin (CRESTOR) 40 MG tablet Take 1 tablet (40 mg total) by mouth daily.   Semaglutide,0.25 or 0.5MG /DOS, (OZEMPIC, 0.25 OR 0.5 MG/DOSE,) 2 MG/3ML SOPN Inject 0.5 mg into the skin once a week.   triamcinolone (NASACORT) 55 MCG/ACT AERO nasal inhaler Place 2 sprays into the nose daily.     Allergies:   Patient has no known allergies.   Social History   Socioeconomic History   Marital status: Single    Spouse name: Not on file   Number of children: 0   Years of  education: 18   Highest education level: Master's degree (e.g., MA, MS, MEng, MEd, MSW, MBA)  Occupational History   Not on file  Tobacco Use   Smoking status: Former    Types: Cigars   Smokeless tobacco: Never   Tobacco comments:    1 cigar every few months.  Vaping Use   Vaping Use: Never used  Substance and Sexual Activity   Alcohol use: Yes    Comment: Few drinks every few months.   Drug use: Never   Sexual activity: Not Currently    Partners: Female  Other Topics Concern   Not on file  Social History Narrative   Not on file   Social Determinants of Health   Financial Resource Strain: Not on file  Food Insecurity: Not on file  Transportation Needs: Not on file  Physical Activity: Not  on file  Stress: Not on file  Social Connections: Not on file     Family History: The patient's family history includes Diabetes in his sister and sister; Hypertension in his brother, father, mother, sister, and sister; Lung cancer in his father; Stroke in his paternal grandmother.  ROS:   Please see the history of present illness.     All other systems reviewed and are negative.  EKGs/Labs/Other Studies Reviewed:    The following studies were reviewed today:    Echo 04/2021  1. Left ventricular ejection fraction, by estimation, is 55 to 60%. The  left ventricle has normal function. The left ventricle has no regional  wall motion abnormalities. There is mild to moderate left ventricular  hypertrophy. Left ventricular diastolic  parameters are consistent with Grade II diastolic dysfunction  (pseudonormalization).   2. Right ventricular systolic function is normal. The right ventricular  size is normal. Tricuspid regurgitation signal is inadequate for assessing  PA pressure.   3. Left atrial size was mildly dilated.   4. The mitral valve is normal in structure. Mild mitral valve  regurgitation. No evidence of mitral stenosis.   5. The aortic valve was not well visualized. Aortic valve regurgitation  is not visualized. No aortic stenosis is present.   6. The inferior vena cava is normal in size with greater than 50%  respiratory variability, suggesting right atrial pressure of 3 mmHg.    Renal US 04/2021 Right: Normal size right kidney. Normal right Resisitive Index.         Abnormal cortical thickness of right kidney. No evidence of         right renal artery stenosis. RRV flow present.  Left:  LRV flow present. No evidence of left renal artery stenosis.         Normal size of left kidney. Normal left Resistive Index.         Abnormal cortical thickness of the left kidney.  Mesenteric:  Normal Celiac artery and Superior Mesenteric artery findings.  Ectatic aorta.   EKG:  EKG  is noy ordered today.     Recent Labs: 02/04/2021: ALT 20 03/20/2021: Hemoglobin 10.8; Platelets 367 07/21/2021: BUN 34; Creatinine, Ser 3.14; Potassium 4.1; Sodium 135  Recent Lipid Panel    Component Value Date/Time   CHOL 281 (H) 02/05/2021 0552   TRIG 442 (H) 02/05/2021 0552   HDL 47 02/05/2021 0552   CHOLHDL 6.0 02/05/2021 0552   VLDL UNABLE TO CALCULATE IF TRIGLYCERIDE OVER 400 mg/dL 02/05/2021 0552   LDLCALC UNABLE TO CALCULATE IF TRIGLYCERIDE OVER 400 mg/dL 02/05/2021 0552   LDLDIRECT 152.5 (H) 02/05/2021 5102  Physical Exam:    VS:  BP 128/84 (BP Location: Left Arm, Patient Position: Sitting, Cuff Size: Normal)   Pulse 82   Ht $R'5\' 4"'FX$  (1.626 m)   Wt 244 lb 4 oz (110.8 kg)   SpO2 98%   BMI 41.93 kg/m     Wt Readings from Last 3 Encounters:  11/24/21 244 lb 4 oz (110.8 kg)  09/22/21 237 lb 12.8 oz (107.9 kg)  08/14/21 237 lb (107.5 kg)     GEN:  Well nourished, well developed in no acute distress HEENT: Normal NECK: No JVD; No carotid bruits LYMPHATICS: No lymphadenopathy CARDIAC: RRR, no murmurs, rubs, gallops RESPIRATORY:  Clear to auscultation without rales, wheezing or rhonchi  ABDOMEN: Soft, non-tender, non-distended MUSCULOSKELETAL:  Mild lower leg edema; No deformity  SKIN: Warm and dry NEUROLOGIC:  Alert and oriented x 3 PSYCHIATRIC:  Normal affect   ASSESSMENT:    1. Chronic diastolic heart failure (HCC)   2. Leg swelling   3. Stage 3 chronic kidney disease, unspecified whether stage 3a or 3b CKD ([place])   4. Morbid obesity ([place])   5. OSA (obstructive sleep apnea)   6. Essential hypertension    PLAN:    In order of problems listed above:  LLE HFpEF This is unchanged. He has mild lower leg edema, however this is much better off amlodipine. I recommended low salt dit, which he is working on. He is wearing compression socks. CKD limiting GDMT and diuretic.   Uncontrolled HTN BP is good today with addition of Imdur. Continue Coreg $RemoveBefore'25mg'IdiPDvDQNHLgT$ BID,  Hydralazine $RemoveBefo'100mg'keKSUpzHbup$  TID and Losartan $RemoveBefo'25mg'aWDsLbkmeyr$  daily. He will continue to check BP at home.   CKD stage 3-4 Labs from March Scr 3.14, BUN 34, which is stable. He follows with nephrology.    Obesity Weight loss recommended.   OSA He is compliant with CPAP.  Disposition: Follow up in 3 month(s) with MD/APP     Signed, Avilyn Virtue Ninfa Meeker, PA-C  11/24/2021 2:10 PM    [facility]

## 2021-11-27 DIAGNOSIS — E1100 Type 2 diabetes mellitus with hyperosmolarity without nonketotic hyperglycemic-hyperosmolar coma (NKHHC): Secondary | ICD-10-CM | POA: Diagnosis not present

## 2021-11-27 DIAGNOSIS — I10 Essential (primary) hypertension: Secondary | ICD-10-CM | POA: Diagnosis not present

## 2021-11-27 DIAGNOSIS — N1831 Chronic kidney disease, stage 3a: Secondary | ICD-10-CM | POA: Diagnosis not present

## 2021-11-27 DIAGNOSIS — N179 Acute kidney failure, unspecified: Secondary | ICD-10-CM | POA: Diagnosis not present

## 2021-11-27 DIAGNOSIS — R801 Persistent proteinuria, unspecified: Secondary | ICD-10-CM | POA: Diagnosis not present

## 2021-11-27 DIAGNOSIS — E1122 Type 2 diabetes mellitus with diabetic chronic kidney disease: Secondary | ICD-10-CM | POA: Diagnosis not present

## 2021-12-04 DIAGNOSIS — R801 Persistent proteinuria, unspecified: Secondary | ICD-10-CM | POA: Diagnosis not present

## 2021-12-04 DIAGNOSIS — N184 Chronic kidney disease, stage 4 (severe): Secondary | ICD-10-CM | POA: Diagnosis not present

## 2021-12-04 DIAGNOSIS — I10 Essential (primary) hypertension: Secondary | ICD-10-CM | POA: Diagnosis not present

## 2021-12-04 DIAGNOSIS — R600 Localized edema: Secondary | ICD-10-CM | POA: Diagnosis not present

## 2021-12-04 DIAGNOSIS — E1122 Type 2 diabetes mellitus with diabetic chronic kidney disease: Secondary | ICD-10-CM | POA: Diagnosis not present

## 2021-12-06 ENCOUNTER — Encounter: Payer: Self-pay | Admitting: Family Medicine

## 2021-12-06 ENCOUNTER — Ambulatory Visit: Payer: 59 | Admitting: Family Medicine

## 2021-12-06 VITALS — BP 140/82 | HR 62 | Ht 63.0 in | Wt 235.4 lb

## 2021-12-06 DIAGNOSIS — N184 Chronic kidney disease, stage 4 (severe): Secondary | ICD-10-CM | POA: Diagnosis not present

## 2021-12-06 DIAGNOSIS — E1122 Type 2 diabetes mellitus with diabetic chronic kidney disease: Secondary | ICD-10-CM

## 2021-12-06 DIAGNOSIS — I10 Essential (primary) hypertension: Secondary | ICD-10-CM

## 2021-12-06 DIAGNOSIS — F411 Generalized anxiety disorder: Secondary | ICD-10-CM | POA: Insufficient documentation

## 2021-12-06 DIAGNOSIS — Z794 Long term (current) use of insulin: Secondary | ICD-10-CM | POA: Diagnosis not present

## 2021-12-06 DIAGNOSIS — E785 Hyperlipidemia, unspecified: Secondary | ICD-10-CM | POA: Diagnosis not present

## 2021-12-06 DIAGNOSIS — R69 Illness, unspecified: Secondary | ICD-10-CM | POA: Diagnosis not present

## 2021-12-06 MED ORDER — ROSUVASTATIN CALCIUM 40 MG PO TABS: 40.0000 mg | ORAL_TABLET | Freq: Every day | ORAL | 1 refills | Status: DC

## 2021-12-06 MED ORDER — SERTRALINE HCL 25 MG PO TABS: 25.0000 mg | ORAL_TABLET | Freq: Every day | ORAL | 0 refills | Status: DC

## 2021-12-06 MED ORDER — LOSARTAN POTASSIUM 25 MG PO TABS: 25.0000 mg | ORAL_TABLET | Freq: Every day | ORAL | 0 refills | Status: DC

## 2021-12-06 NOTE — Assessment & Plan Note (Signed)
Continue to follow peripherally on this issue, recent readings at cardiologist were in range, today elevation is noted.

## 2021-12-06 NOTE — Assessment & Plan Note (Signed)
Patient reports some understandable increased stressors due to multiple comorbid health factors, this has manifested clinically as difficulty with sleep initiation, and despite use of CPAP, nighttime awakening with difficulty falling back asleep.  We discussed the multiple possible etiologies, and due to comorbid medical risk factors, will refrain from depressants, initiate sertraline, information provided regarding sleep hygiene, and can trial as needed melatonin.  Will follow-up on the issue in 2 months.

## 2021-12-06 NOTE — Patient Instructions (Signed)
-   Continue current medications and maintain follow-up with specialist - Start sertraline daily - Review sleep hygiene information and dose melatonin nightly on an as-needed basis - Obtain fasting labs with orders provided - Referral coordinator will contact you to schedule visit with nutritionist - Return for follow-up in 2 months

## 2021-12-06 NOTE — Assessment & Plan Note (Signed)
Updated fasting labs ordered, enter refill, can modify statin once labs result.

## 2021-12-06 NOTE — Progress Notes (Signed)
Primary Care / Sports Medicine Office Visit  Patient Information:  Patient ID: David Klein, male DOB: 1970/06/26 Age: 51 y.o. MRN: [id number]   David Klein is a pleasant 51 y.o. male presenting with the following:  Chief Complaint  Patient presents with   Hypertension    Vitals:   12/06/21 1029  BP: (!) 140/82  Pulse: 62  SpO2: 98%   Vitals:   12/06/21 1029  Weight: 235 lb 6.4 oz (106.8 kg)  Height: 5\' 3"  (1.6 m)   Body mass index is 41.7 kg/m.  No results found.   Independent interpretation of notes and tests performed by another provider:   None  Procedures performed:   None  Pertinent History, Exam, Impression, and Recommendations:   Problem List Items Addressed This Visit       Cardiovascular and Mediastinum   Hypertension    Continue to follow peripherally on this issue, recent readings at cardiologist were in range, today elevation is noted.      Relevant Medications   torsemide (DEMADEX) 20 MG tablet   rosuvastatin (CRESTOR) 40 MG tablet   losartan (COZAAR) 25 MG tablet   Other Relevant Orders   Apo A1 + B + Ratio   Comprehensive metabolic panel   Lipid panel   Referral to Nutrition and Diabetes Services     Genitourinary   Chronic kidney disease, stage 4 (severe) (HCC)    Continues to be managed by nephrology, referral to nutritionist placed for further optimization.      Relevant Orders   Referral to Nutrition and Diabetes Services     Other   Morbid obesity [name])    Referral to nutritionist placed, lifestyle modifications encouraged.      Hyperlipidemia    Updated fasting labs ordered, enter refill, can modify statin once labs result.      Relevant Medications   torsemide (DEMADEX) 20 MG tablet   rosuvastatin (CRESTOR) 40 MG tablet   losartan (COZAAR) 25 MG tablet   Other Relevant Orders   Apo A1 + B + Ratio   Comprehensive metabolic panel   Lipid panel   Referral to Nutrition and Diabetes Services   GAD  (generalized anxiety disorder) - Primary    Patient reports some understandable increased stressors due to multiple comorbid health factors, this has manifested clinically as difficulty with sleep initiation, and despite use of CPAP, nighttime awakening with difficulty falling back asleep.  We discussed the multiple possible etiologies, and due to comorbid medical risk factors, will refrain from depressants, initiate sertraline, information provided regarding sleep hygiene, and can trial as needed melatonin.  Will follow-up on the issue in 2 months.      Relevant Medications   sertraline (ZOLOFT) 25 MG tablet   Other Visit Diagnoses     Type 2 diabetes mellitus with stage 4 chronic kidney disease, with long-term current use of insulin (HCC)       Relevant Medications   rosuvastatin (CRESTOR) 40 MG tablet   losartan (COZAAR) 25 MG tablet   Other Relevant Orders   Referral to Nutrition and Diabetes Services        Orders & Medications Meds ordered this encounter  Medications   rosuvastatin (CRESTOR) 40 MG tablet    Sig: Take 1 tablet (40 mg total) by mouth daily.    Dispense:  90 tablet    Refill:  1   losartan (COZAAR) 25 MG tablet    Sig: Take 1 tablet (25 mg total)  by mouth daily.    Dispense:  90 tablet    Refill:  0   sertraline (ZOLOFT) 25 MG tablet    Sig: Take 1 tablet (25 mg total) by mouth daily.    Dispense:  90 tablet    Refill:  0   Orders Placed This Encounter  Procedures   Apo A1 + B + Ratio   Comprehensive metabolic panel   Lipid panel   Referral to Nutrition and Diabetes Services     Return in about 2 months (around 02/05/2022).     Montel Culver, MD   Primary Care Sports Medicine [place]

## 2021-12-06 NOTE — Assessment & Plan Note (Signed)
Referral to nutritionist placed, lifestyle modifications encouraged.

## 2021-12-06 NOTE — Assessment & Plan Note (Signed)
Continues to be managed by nephrology, referral to nutritionist placed for further optimization.

## 2021-12-18 DIAGNOSIS — E785 Hyperlipidemia, unspecified: Secondary | ICD-10-CM | POA: Diagnosis not present

## 2021-12-18 DIAGNOSIS — I10 Essential (primary) hypertension: Secondary | ICD-10-CM | POA: Diagnosis not present

## 2021-12-19 LAB — LIPID PANEL
Chol/HDL Ratio: 2.1 ratio (ref 0.0–5.0)
Cholesterol, Total: 75 mg/dL — ABNORMAL LOW (ref 100–199)
HDL: 36 mg/dL — ABNORMAL LOW (ref >39)
LDL Chol Calc (NIH): 14 mg/dL (ref 0–99)
Triglycerides: 146 mg/dL (ref 0–149)
VLDL Cholesterol Cal: 25 mg/dL (ref 5–40)

## 2021-12-19 LAB — COMPREHENSIVE METABOLIC PANEL
ALT: 21 IU/L (ref 0–44)
AST: 24 IU/L (ref 0–40)
Albumin/Globulin Ratio: 1.3 (ref 1.2–2.2)
Albumin: 4.5 g/dL (ref 4.1–5.1)
Alkaline Phosphatase: 68 IU/L (ref 44–121)
BUN/Creatinine Ratio: 15 (ref 9–20)
BUN: 63 mg/dL — ABNORMAL HIGH (ref 6–24)
Bilirubin Total: 0.3 mg/dL (ref 0.0–1.2)
CO2: 20 mmol/L (ref 20–29)
Calcium: 9.5 mg/dL (ref 8.7–10.2)
Chloride: 98 mmol/L (ref 96–106)
Creatinine, Ser: 4.23 mg/dL — ABNORMAL HIGH (ref 0.76–1.27)
Globulin, Total: 3.5 g/dL (ref 1.5–4.5)
Glucose: 177 mg/dL — ABNORMAL HIGH (ref 70–99)
Potassium: 4.2 mmol/L (ref 3.5–5.2)
Sodium: 135 mmol/L (ref 134–144)
Total Protein: 8 g/dL (ref 6.0–8.5)
eGFR: 16 mL/min/{1.73_m2} — ABNORMAL LOW (ref >59)

## 2021-12-19 LAB — APO A1 + B + RATIO
Apolipo. B/A-1 Ratio: 0.3 ratio (ref 0.0–0.7)
Apolipoprotein A-1: 111 mg/dL (ref 101–178)
Apolipoprotein B: 30 mg/dL (ref <90)

## 2021-12-22 ENCOUNTER — Encounter: Payer: Self-pay | Admitting: Medical

## 2021-12-22 DIAGNOSIS — G4733 Obstructive sleep apnea (adult) (pediatric): Secondary | ICD-10-CM | POA: Diagnosis not present

## 2021-12-23 DIAGNOSIS — G4733 Obstructive sleep apnea (adult) (pediatric): Secondary | ICD-10-CM | POA: Diagnosis not present

## 2022-01-08 ENCOUNTER — Encounter: Payer: 59 | Attending: Family Medicine | Admitting: *Deleted

## 2022-01-08 ENCOUNTER — Encounter: Payer: Self-pay | Admitting: *Deleted

## 2022-01-08 VITALS — BP 140/80 | Ht 63.0 in | Wt 230.8 lb

## 2022-01-08 DIAGNOSIS — Z794 Long term (current) use of insulin: Secondary | ICD-10-CM | POA: Diagnosis not present

## 2022-01-08 DIAGNOSIS — I129 Hypertensive chronic kidney disease with stage 1 through stage 4 chronic kidney disease, or unspecified chronic kidney disease: Secondary | ICD-10-CM | POA: Diagnosis not present

## 2022-01-08 DIAGNOSIS — E785 Hyperlipidemia, unspecified: Secondary | ICD-10-CM | POA: Diagnosis not present

## 2022-01-08 DIAGNOSIS — N184 Chronic kidney disease, stage 4 (severe): Secondary | ICD-10-CM | POA: Diagnosis not present

## 2022-01-08 DIAGNOSIS — Z713 Dietary counseling and surveillance: Secondary | ICD-10-CM | POA: Diagnosis not present

## 2022-01-08 DIAGNOSIS — E1122 Type 2 diabetes mellitus with diabetic chronic kidney disease: Secondary | ICD-10-CM | POA: Diagnosis not present

## 2022-01-08 NOTE — Patient Instructions (Signed)
Check blood sugars before each meal, 2 hrs after meals, before bed every day and as needed with FreeStyle Libre Bring blood sugar records/phone to the next appointment  Exercise:  Continue walking  for    30  minutes   1-4  days a week and increase to 30 minutes 5 x week (150 minutes/week)  Eat 3 meals day,   1-2  snacks a day Space meals 4-6 hours apart Include protein with each meal and when eating fruit for a snack  Complete 3 Day Food Record and bring to next appt  Make an eye doctor appointment  Carry fast acting glucose and a snack at all times Rotate injection sites  Return for appointment on:  Monday April 09, 2022 at 3:15 pm with Freda Munro (nurse)

## 2022-01-08 NOTE — Progress Notes (Signed)
Diabetes Self-Management Education  Visit Type: First/Initial  Appt. Start Time: 0845 Appt. End Time: 6237  01/08/2022  [name], identified by name and date of birth, is a 51 y.o. male with a diagnosis of Diabetes: Type 2.   ASSESSMENT  Blood pressure (!) 140/80, height 5\' 3"  (1.6 m), weight 230 lb 12.8 oz (104.7 kg). Body mass index is 40.88 kg/m.   Diabetes Self-Management Education - 01/08/22 1046       Visit Information   Visit Type First/Initial      Initial Visit   Diabetes Type Type 2    Date Diagnosed 15 years    Are you currently following a meal plan? No    Are you taking your medications as prescribed? Yes      Health Coping   How would you rate your overall health? Fair      Psychosocial Assessment   Patient Belief/Attitude about Diabetes Other (comment)   dis-heartening   What is the hardest part about your diabetes right now, causing you the most concern, or is the most worrisome to you about your diabetes?   Making healty food and beverage choices    Self-care barriers None    Self-management support Doctor's office    Patient Concerns Nutrition/Meal planning;Glycemic Control;Weight Control;Medication;Monitoring;Healthy Lifestyle    Special Needs None    Preferred Learning Style Auditory;Visual;Hands on    Learning Readiness Ready    How often do you need to have someone help you when you read instructions, pamphlets, or other written materials from your doctor or pharmacy? 1 - Never    What is the last grade level you completed in school? Masters      Pre-Education Assessment   Patient understands the diabetes disease and treatment process. Needs Review    Patient understands incorporating nutritional management into lifestyle. Needs Review    Patient undertands incorporating physical activity into lifestyle. Needs Review    Patient understands using medications safely. Needs Review    Patient understands monitoring blood glucose, interpreting  and using results Demonstrates understanding / competency    Patient understands prevention, detection, and treatment of acute complications. Needs Review    Patient understands prevention, detection, and treatment of chronic complications. Demonstrates understanding / competency    Patient understands how to develop strategies to address psychosocial issues. Needs Review    Patient understands how to develop strategies to promote health/change behavior. Needs Review      Complications   Last HgB A1C per patient/outside source 7.5 %   10/02/2021   How often do you check your blood sugar? 3-4 times/day   FreeStyle Libre - 30 day avg 159 mg/dL; > 240 mg/dL 5%, 181-240 mg/dL 23%, Time in Range 72%, <79 mg/dL 0%; 90 day avg 150 mg/dL; >240 mg/dL 4%, 181-240 mg/dL 16%, Time in Range 80%, <79 mg/dL 0%   Number of hypoglycemic episodes per month 1    Can you tell when your blood sugar is low? No   Pt reports Libre alerted him at 68 mg/dL last night but didn't have any symptoms. He treated with hard candy.   Number of hyperglycemic episodes ( >200mg /dL): Daily    Can you tell when your blood sugar is high? No    Have you had a dilated eye exam in the past 12 months? No    Have you had a dental exam in the past 12 months? Yes    Are you checking your feet? Yes    How  many days per week are you checking your feet? 1      Dietary Intake   Breakfast oatmeal and fruit (berries); chicken biscuit; bacon, egg and cheese biscuit    Snack (morning) 2 snacks/day - fruit (blueberries, blackberries, cherries, grapes, apple mandarin); chips, popcorn, cheese, [place] Yogurt    Lunch [place] - burrito, rice, beans; 1/2 sub; [place] sandwich on whole wheat bread with chips or fruit cup; tuna salad and crackers    Dinner chicken, beef 1 x week, fish; potaotes, slaw, black beans, green beans, squash, zucchini, broccoli, occasional chef salad    Beverage(s) water, Coke zero      Activity / Exercise   Activity / Exercise  Type Light (walking / raking leaves)    How many days per week do you exercise? 2.5    How many minutes per day do you exercise? 30    Total minutes per week of exercise 75      Patient Education   Previous Diabetes Education Yes (please comment)   he has been offered   Disease Pathophysiology Explored patient's options for treatment of their diabetes    Healthy Eating Role of diet in the treatment of diabetes and the relationship between the three main macronutrients and blood glucose level;Food label reading, portion sizes and measuring food.;Reviewed blood glucose goals for pre and post meals and how to evaluate the patients' food intake on their blood glucose level.;Information on hints to eating out and maintain blood glucose control.    Being Active Role of exercise on diabetes management, blood pressure control and cardiac health.    Medications Taught/reviewed insulin/injectables, injection, site rotation, insulin/injectables storage and needle disposal.;Reviewed patients medication for diabetes, action, purpose, timing of dose and side effects.    Monitoring Taught/evaluated CGM (comment);Taught/discussed recording of test results and interpretation of SMBG.;Identified appropriate SMBG and/or A1C goals.;Yearly dilated eye exam    Acute complications Taught prevention, symptoms, and  treatment of hypoglycemia - the 15 rule.    Chronic complications Relationship between chronic complications and blood glucose control    Diabetes Stress and Support Identified and addressed patients feelings and concerns about diabetes      Individualized Goals (developed by patient)   Reducing Risk Other (comment)   improve blood sugars, decrease medications, prevent diabetes complications, lsoe weight, lead a healthier lifestyle, become more fit     Outcomes   Expected Outcomes Demonstrated interest in learning. Expect positive outcomes    Future DMSE 3-4 months         Individualized Plan for  Diabetes Self-Management Training:   Learning Objective:  Patient will have a greater understanding of diabetes self-management. Patient education plan is to attend individual and/or group sessions per assessed needs and concerns.   Plan:   Patient Instructions  Check blood sugars before each meal, 2 hrs after meals, before bed every day and as needed with FreeStyle Libre Bring blood sugar records/phone to the next appointment  Exercise:  Continue walking  for    30  minutes   1-4  days a week and increase to 30 minutes 5 x week (150 minutes/week)  Eat 3 meals day,   1-2  snacks a day Space meals 4-6 hours apart Include protein with each meal and when eating fruit for a snack  Complete 3 Day Food Record and bring to next appt  Make an eye doctor appointment  Carry fast acting glucose and a snack at all times Rotate injection sites  Return for appointment  on:  Monday April 09, 2022 at 3:15 pm with Freda Munro (nurse)   Expected Outcomes:  Demonstrated interest in learning. Expect positive outcomes  Education material provided:  General Meal Planning Guidelines Simple Meal Plan 3 Day Food Record Glucose tablets Symptoms, causes and treatments of Hypoglycemia Carb-Mindful Smoothie Handout FreeStyle Libre AVS  If problems or questions, patient to contact team via:   Johny Drilling, RN, [place] [phone]  Future DSME appointment: 3-4 months April 09, 2022 with this educator

## 2022-01-22 DIAGNOSIS — G4733 Obstructive sleep apnea (adult) (pediatric): Secondary | ICD-10-CM | POA: Diagnosis not present

## 2022-01-23 DIAGNOSIS — G4733 Obstructive sleep apnea (adult) (pediatric): Secondary | ICD-10-CM | POA: Diagnosis not present

## 2022-02-05 ENCOUNTER — Ambulatory Visit: Payer: 59 | Admitting: Family Medicine

## 2022-02-05 ENCOUNTER — Encounter: Payer: Self-pay | Admitting: Family Medicine

## 2022-02-05 VITALS — BP 136/82 | HR 78 | Ht 63.0 in | Wt 228.6 lb

## 2022-02-05 DIAGNOSIS — E785 Hyperlipidemia, unspecified: Secondary | ICD-10-CM

## 2022-02-05 DIAGNOSIS — F411 Generalized anxiety disorder: Secondary | ICD-10-CM | POA: Diagnosis not present

## 2022-02-05 DIAGNOSIS — R69 Illness, unspecified: Secondary | ICD-10-CM | POA: Diagnosis not present

## 2022-02-05 DIAGNOSIS — Z1211 Encounter for screening for malignant neoplasm of colon: Secondary | ICD-10-CM | POA: Diagnosis not present

## 2022-02-05 DIAGNOSIS — G4733 Obstructive sleep apnea (adult) (pediatric): Secondary | ICD-10-CM | POA: Diagnosis not present

## 2022-02-05 DIAGNOSIS — Z23 Encounter for immunization: Secondary | ICD-10-CM

## 2022-02-05 MED ORDER — SERTRALINE HCL 50 MG PO TABS: 50.0000 mg | ORAL_TABLET | Freq: Every day | ORAL | 0 refills | Status: DC

## 2022-02-05 MED ORDER — PEN NEEDLES 31G X 5 MM MISC: 15.0000 [IU] | Freq: Every day | 1 refills | Status: DC

## 2022-02-05 NOTE — Patient Instructions (Signed)
-   Take new sertraline 50 mg dose - Contact us at/beyond 6 weeks if further dose changes are needed - Referral coordinator will touch base about following up with sleep medicine and gastroenterology - Reach out to your endocrinologist about your current diabetes medications as well as diabetic eye and foot exams - Return for follow-up in 3 months

## 2022-02-05 NOTE — Assessment & Plan Note (Signed)
Has tolerated sertraline 25 mg well, describes improvement in sleep initiation, still having nighttime awakenings at this may become unfounded by concomitant obstructive sleep apnea. See additional assessment(s) for plan details.  Plan to further titrate to 50 mg sertraline dose nightly, we will follow-up on this issue in 3 months.

## 2022-02-05 NOTE — Assessment & Plan Note (Signed)
Tdap administered today. 

## 2022-02-05 NOTE — Progress Notes (Addendum)
     Primary Care / Sports Medicine Office Visit  Patient Information:  Patient ID: David Klein, male DOB: [birth year] Age: 51 y.o. MRN: [id number]   David Klein is a pleasant 51 y.o. male presenting with the following:  Chief Complaint  Patient presents with   Follow-up    Vitals:   02/05/22 1050  BP: 136/82  Pulse: 78  SpO2: 98%   Vitals:   02/05/22 1050  Weight: 228 lb 9.6 oz (103.7 kg)  Height: 5\' 3"  (1.6 m)   Body mass index is 40.49 kg/m.  No results found.   Independent interpretation of notes and tests performed by another provider:   None  Procedures performed:   None  Pertinent History, Exam, Impression, and Recommendations:   Problem List Items Addressed This Visit       Respiratory   OSA on CPAP    States compliance with the same, reviewed data from patient's phone demonstrating progressive worsening of nighttime awakenings, mask leak issues.  Given his multiple comorbid risk factors, referral to sleep medicine placed for further optimization/possible adjustment of his CPAP.  We will follow peripherally on this issue.      Relevant Orders   Ambulatory referral to Sleep Studies     Other   Hyperlipidemia    Patient on Ozempic for diabetes mellitus type 2, has been titrated to 1 mg and tolerating well by his endocrinologist.  He had some questions about his current medications and we reviewed his medications as they stand in epic, persistent questions have been deferred to endocrinology to ensure no deviations from their plan.  Patient did have an opportunity to establish with nutritionist, is making lifestyle modifications.      GAD (generalized anxiety disorder) - Primary    Has tolerated sertraline 25 mg well, describes improvement in sleep initiation, still having nighttime awakenings at this may become unfounded by concomitant obstructive sleep apnea. See additional assessment(s) for plan details.  Plan to further titrate to 50 mg  sertraline dose nightly, we will follow-up on this issue in 3 months.      Relevant Medications   sertraline (ZOLOFT) 50 MG tablet   Need for Tdap vaccination    Tdap administered today.      Relevant Orders   Tdap vaccine greater than or equal to 7yo IM (Completed)   Colon cancer screening    Referral for screening colonoscopy placed today.      Relevant Orders   Ambulatory referral to Gastroenterology     Orders & Medications Meds ordered this encounter  Medications   Insulin Pen Needle (PEN NEEDLES) 31G X 5 MM MISC    Sig: 15 Units by Does not apply route at bedtime.    Dispense:  100 each    Refill:  1   sertraline (ZOLOFT) 50 MG tablet    Sig: Take 1 tablet (50 mg total) by mouth daily.    Dispense:  90 tablet    Refill:  0   Orders Placed This Encounter  Procedures   Tdap vaccine greater than or equal to 7yo IM   Ambulatory referral to Sleep Studies   Ambulatory referral to Gastroenterology     Return in about 3 months (around 05/08/2022).     Montel Culver, MD   Primary Care Sports Medicine [place]

## 2022-02-05 NOTE — Assessment & Plan Note (Signed)
Referral for screening colonoscopy placed today. 

## 2022-02-05 NOTE — Assessment & Plan Note (Signed)
States compliance with the same, reviewed data from patient's phone demonstrating progressive worsening of nighttime awakenings, mask leak issues.  Given his multiple comorbid risk factors, referral to sleep medicine placed for further optimization/possible adjustment of his CPAP.  We will follow peripherally on this issue.

## 2022-02-05 NOTE — Assessment & Plan Note (Signed)
Patient on Ozempic for diabetes mellitus type 2, has been titrated to 1 mg and tolerating well by his endocrinologist.  He had some questions about his current medications and we reviewed his medications as they stand in epic, persistent questions have been deferred to endocrinology to ensure no deviations from their plan.  Patient did have an opportunity to establish with nutritionist, is making lifestyle modifications.

## 2022-02-06 ENCOUNTER — Telehealth: Payer: Self-pay

## 2022-02-06 ENCOUNTER — Other Ambulatory Visit: Payer: Self-pay

## 2022-02-06 DIAGNOSIS — Z1211 Encounter for screening for malignant neoplasm of colon: Secondary | ICD-10-CM

## 2022-02-06 LAB — HM COLONOSCOPY

## 2022-02-06 MED ORDER — NA SULFATE-K SULFATE-MG SULF 17.5-3.13-1.6 GM/177ML PO SOLN: 1.0000 | Freq: Once | ORAL | 0 refills | Status: AC

## 2022-02-06 NOTE — Telephone Encounter (Signed)
Gastroenterology Pre-Procedure Review  Request Date: 03/07/22 Requesting Physician: Dr. Marius Ditch  PATIENT REVIEW QUESTIONS: The patient responded to the following health history questions as indicated:    1. Are you having any GI issues? no 2. Do you have a personal history of Polyps? no 3. Do you have a family history of Colon Cancer or Polyps? no 4. Diabetes Mellitus? yes (patient has been advised to hold Ozempic 7 days, Take half dose of Insulin the night before, Hold Glimipiride 1 day prior) 5. Joint replacements in the past 12 months?no 6. Major health problems in the past 3 months?no 7. Any artificial heart valves, MVP, or defibrillator?no    MEDICATIONS & ALLERGIES:    Patient reports the following regarding taking any anticoagulation/antiplatelet therapy:   Plavix, Coumadin, Eliquis, Xarelto, Lovenox, Pradaxa, Brilinta, or Effient? no Aspirin? no  Patient confirms/reports the following medications:  Current Outpatient Medications  Medication Sig Dispense Refill   aspirin 81 MG EC tablet Take 1 tablet (81 mg total) by mouth daily. 30 tablet 12   blood glucose meter kit and supplies KIT Dispense based on patient and insurance preference. Use up to four times daily as directed. (Patient taking differently: Inject 1 each into the skin See admin instructions. Dispense based on patient and insurance preference. Use up to four times daily as directed.) 1 each 0   carvedilol (COREG) 25 MG tablet Take 1 tablet (25 mg total) by mouth 2 (two) times daily. 180 tablet 2   Continuous Blood Gluc Sensor (FREESTYLE LIBRE 2 SENSOR) MISC 1 Device by Does not apply route every 14 (fourteen) days. 6 each 3   diphenhydrAMINE HCl (BENADRYL ALLERGY PO) Take 1 tablet by mouth 2 (two) times daily as needed (allergies).     hydrALAZINE (APRESOLINE) 100 MG tablet Take 100 mg by mouth 3 (three) times daily.     insulin glargine (LANTUS SOLOSTAR) 100 UNIT/ML Solostar Pen Inject 40 Units into the skin every  morning. And pen needles 1/day. 45 mL PRN   Insulin Pen Needle (PEN NEEDLES) 31G X 5 MM MISC 15 Units by Does not apply route at bedtime. 100 each 1   isosorbide mononitrate (IMDUR) 30 MG 24 hr tablet Take 1 tablet (30 mg total) by mouth daily. 90 tablet 3   rosuvastatin (CRESTOR) 40 MG tablet Take 1 tablet (40 mg total) by mouth daily. 90 tablet 1   Semaglutide,0.25 or 0.5MG /DOS, (OZEMPIC, 0.25 OR 0.5 MG/DOSE,) 2 MG/3ML SOPN Inject 0.5 mg into the skin once a week. (Patient taking differently: Inject 1 mg into the skin once a week.) 9 mL 3   sertraline (ZOLOFT) 50 MG tablet Take 1 tablet (50 mg total) by mouth daily. 90 tablet 0   torsemide (DEMADEX) 20 MG tablet Take 20 mg by mouth 2 (two) times daily.     triamcinolone (NASACORT) 55 MCG/ACT AERO nasal inhaler Place 2 sprays into the nose daily as needed.     No current facility-administered medications for this visit.    Patient confirms/reports the following allergies:  No Known Allergies  No orders of the defined types were placed in this encounter.   AUTHORIZATION INFORMATION Primary Insurance: 1D#: Group #:  Secondary Insurance: 1D#: Group #:  SCHEDULE INFORMATION: Date: 03/07/22 Time: Location: [facility]

## 2022-02-21 DIAGNOSIS — G4733 Obstructive sleep apnea (adult) (pediatric): Secondary | ICD-10-CM | POA: Diagnosis not present

## 2022-02-22 DIAGNOSIS — G4733 Obstructive sleep apnea (adult) (pediatric): Secondary | ICD-10-CM | POA: Diagnosis not present

## 2022-02-28 ENCOUNTER — Ambulatory Visit: Payer: 59 | Admitting: Medical

## 2022-03-03 ENCOUNTER — Other Ambulatory Visit: Payer: Self-pay | Admitting: Family Medicine

## 2022-03-03 DIAGNOSIS — F411 Generalized anxiety disorder: Secondary | ICD-10-CM

## 2022-03-05 NOTE — Telephone Encounter (Signed)
D/c'd 02/05/22 J. Zigmund Daniel MD   Requested Prescriptions  Refused Prescriptions Disp Refills   sertraline (ZOLOFT) 25 MG tablet [Pharmacy Med Name: SERTRALINE HCL 25 MG TABLET] 90 tablet 0    Sig: TAKE 1 TABLET (25 MG TOTAL) BY MOUTH DAILY.     Psychiatry:  Antidepressants - SSRI - sertraline Passed - 03/03/2022  9:46 AM      Passed - AST in normal range and within 360 days    AST  Date Value Ref Range Status  12/18/2021 24 0 - 40 IU/L Final         Passed - ALT in normal range and within 360 days    ALT  Date Value Ref Range Status  12/18/2021 21 0 - 44 IU/L Final         Passed - Completed PHQ-2 or PHQ-9 in the last 360 days      Passed - Valid encounter within last 6 months    Recent Outpatient Visits           4 weeks ago GAD (generalized anxiety disorder)   [place] [facility] at [place], Earley Abide, MD   2 months ago GAD (generalized anxiety disorder)   [place] [facility] at [place], Earley Abide, MD   6 months ago Hypertension, unspecified type   [facility] at [facility], Earley Abide, MD   9 months ago Type 2 diabetes mellitus with hyperosmolar hyperglycemic state (HHS) [facility])   [place] [facility] at [place], Earley Abide, MD   12 months ago Hypertension, unspecified type   [facility] Medicine at [place], MD       Future Appointments             In 3 weeks Kathlen Mody, Cadence H, PA-C [facility] [place]   In 2 months Zigmund Daniel, Earley Abide, MD [place] and Sports Medicine at [facility], [facility]

## 2022-03-07 ENCOUNTER — Encounter
Admission: RE | Disposition: A | Discharge status: Home / Self Care | Payer: Self-pay | Source: Home / Self Care | Attending: Gastroenterology

## 2022-03-07 ENCOUNTER — Ambulatory Visit
Admission: RE | Admit: 2022-03-07 | Discharge: 2022-03-07 | Disposition: A | Discharge status: Home / Self Care | Payer: 59 | Attending: Gastroenterology | Admitting: Gastroenterology

## 2022-03-07 ENCOUNTER — Encounter: Payer: Self-pay | Admitting: Gastroenterology

## 2022-03-07 ENCOUNTER — Ambulatory Visit: Payer: 59

## 2022-03-07 DIAGNOSIS — E785 Hyperlipidemia, unspecified: Secondary | ICD-10-CM | POA: Insufficient documentation

## 2022-03-07 DIAGNOSIS — Z87891 Personal history of nicotine dependence: Secondary | ICD-10-CM | POA: Diagnosis not present

## 2022-03-07 DIAGNOSIS — F419 Anxiety disorder, unspecified: Secondary | ICD-10-CM | POA: Diagnosis not present

## 2022-03-07 DIAGNOSIS — N183 Chronic kidney disease, stage 3 unspecified: Secondary | ICD-10-CM

## 2022-03-07 DIAGNOSIS — Z1211 Encounter for screening for malignant neoplasm of colon: Secondary | ICD-10-CM | POA: Diagnosis not present

## 2022-03-07 DIAGNOSIS — G4733 Obstructive sleep apnea (adult) (pediatric): Secondary | ICD-10-CM | POA: Diagnosis not present

## 2022-03-07 DIAGNOSIS — E119 Type 2 diabetes mellitus without complications: Secondary | ICD-10-CM | POA: Insufficient documentation

## 2022-03-07 DIAGNOSIS — Z9989 Dependence on other enabling machines and devices: Secondary | ICD-10-CM | POA: Diagnosis not present

## 2022-03-07 DIAGNOSIS — G4730 Sleep apnea, unspecified: Secondary | ICD-10-CM | POA: Insufficient documentation

## 2022-03-07 DIAGNOSIS — I10 Essential (primary) hypertension: Secondary | ICD-10-CM | POA: Insufficient documentation

## 2022-03-07 DIAGNOSIS — Z6841 Body Mass Index (BMI) 40.0 and over, adult: Secondary | ICD-10-CM | POA: Insufficient documentation

## 2022-03-07 DIAGNOSIS — R69 Illness, unspecified: Secondary | ICD-10-CM | POA: Diagnosis not present

## 2022-03-07 DIAGNOSIS — Z794 Long term (current) use of insulin: Secondary | ICD-10-CM | POA: Diagnosis not present

## 2022-03-07 HISTORY — PX: COLONOSCOPY WITH PROPOFOL: SHX5780

## 2022-03-07 HISTORY — DX: Sleep apnea, unspecified: G47.30

## 2022-03-07 LAB — GLUCOSE, CAPILLARY: Glucose-Capillary: 132 mg/dL — ABNORMAL HIGH (ref 70–99)

## 2022-03-07 SURGERY — COLONOSCOPY WITH PROPOFOL
Anesthesia: General

## 2022-03-07 MED ORDER — PROPOFOL 10 MG/ML IV BOLUS
INTRAVENOUS | Status: DC | PRN
  Administered 2022-03-07: 50 mg via INTRAVENOUS
  Administered 2022-03-07: 20 mg via INTRAVENOUS

## 2022-03-07 MED ORDER — DEXMEDETOMIDINE HCL IN NACL 80 MCG/20ML IV SOLN
INTRAVENOUS | Status: AC
  Filled 2022-03-07: qty 20

## 2022-03-07 MED ORDER — LIDOCAINE HCL (PF) 2 % IJ SOLN
INTRAMUSCULAR | Status: AC
  Filled 2022-03-07: qty 5

## 2022-03-07 MED ORDER — LIDOCAINE HCL (CARDIAC) PF 100 MG/5ML IV SOSY
PREFILLED_SYRINGE | INTRAVENOUS | Status: DC | PRN
  Administered 2022-03-07: 50 mg via INTRAVENOUS

## 2022-03-07 MED ORDER — GLYCOPYRROLATE 0.2 MG/ML IJ SOLN
INTRAMUSCULAR | Status: AC
  Filled 2022-03-07: qty 1

## 2022-03-07 MED ORDER — SODIUM CHLORIDE 0.9 % IV SOLN: INTRAVENOUS | Status: DC

## 2022-03-07 MED ORDER — PROPOFOL 1000 MG/100ML IV EMUL
INTRAVENOUS | Status: AC
  Filled 2022-03-07: qty 100

## 2022-03-07 MED ORDER — PROPOFOL 500 MG/50ML IV EMUL
INTRAVENOUS | Status: DC | PRN
  Administered 2022-03-07: 140 ug/kg/min via INTRAVENOUS

## 2022-03-07 MED ORDER — GLYCOPYRROLATE 0.2 MG/ML IJ SOLN
INTRAMUSCULAR | Status: DC | PRN
  Administered 2022-03-07: .2 mg via INTRAVENOUS

## 2022-03-07 NOTE — Anesthesia Procedure Notes (Signed)
Procedure Name: MAC Date/Time: 03/07/2022 8:37 AM  Performed by: Tollie Eth, CRNAPre-anesthesia Checklist: Patient identified, Emergency Drugs available, Suction available and Patient being monitored Patient Re-evaluated:Patient Re-evaluated prior to induction Oxygen Delivery Method: Simple face mask Induction Type: IV induction Placement Confirmation: positive ETCO2

## 2022-03-07 NOTE — H&P (Signed)
Cephas Darby, MD [address]  [place]  [place], [place] [address]  Main: [phone]  Fax: [phone] Pager: [phone]  Primary Care Physician:  Montel Culver, MD Primary Gastroenterologist:  Dr. Cephas Darby  Pre-Procedure History & Physical: HPI:  David Klein is a 51 y.o. male is here for an colonoscopy.   Past Medical History:  Diagnosis Date   Allergy    Diabetes mellitus without complication ([place])    Hyperlipidemia    Hypertension    Sleep apnea     Past Surgical History:  Procedure Laterality Date   TOOTH EXTRACTION     WISDOM TOOTH EXTRACTION Bilateral     Prior to Admission medications   Medication Sig Start Date End Date Taking? Authorizing Provider  aspirin 81 MG EC tablet Take 1 tablet (81 mg total) by mouth daily. 02/05/21  Yes Lorella Nimrod, MD  carvedilol (COREG) 25 MG tablet Take 1 tablet (25 mg total) by mouth 2 (two) times daily. 10/17/21  Yes End, Harrell Gave, MD  hydrALAZINE (APRESOLINE) 100 MG tablet Take 100 mg by mouth 3 (three) times daily.   Yes [provider]  insulin glargine (LANTUS SOLOSTAR) 100 UNIT/ML Solostar Pen Inject 40 Units into the skin every morning. And pen needles 1/day. 06/23/21  Yes Renato Shin, MD  isosorbide mononitrate (IMDUR) 30 MG 24 hr tablet Take 1 tablet (30 mg total) by mouth daily. 09/22/21  Yes [name], Cadence H, PA-C  rosuvastatin (CRESTOR) 40 MG tablet Take 1 tablet (40 mg total) by mouth daily. 12/06/21  Yes Montel Culver, MD  sertraline (ZOLOFT) 50 MG tablet Take 1 tablet (50 mg total) by mouth daily. 02/05/22  Yes Montel Culver, MD  torsemide (DEMADEX) 20 MG tablet Take 20 mg by mouth 2 (two) times daily. 09/15/21  Yes [provider]  blood glucose meter kit and supplies KIT Dispense based on patient and insurance preference. Use up to four times daily as directed. Patient taking differently: Inject 1 each into the skin See admin instructions. Dispense based on patient  and insurance preference. Use up to four times daily as directed. 02/05/21   Lorella Nimrod, MD  Continuous Blood Gluc Sensor (FREESTYLE LIBRE 2 SENSOR) MISC 1 Device by Does not apply route every 14 (fourteen) days. 06/23/21   Renato Shin, MD  diphenhydrAMINE HCl (BENADRYL ALLERGY PO) Take 1 tablet by mouth 2 (two) times daily as needed (allergies).    [provider]  Insulin Pen Needle (PEN NEEDLES) 31G X 5 MM MISC 15 Units by Does not apply route at bedtime. 02/05/22   Montel Culver, MD  Semaglutide,0.25 or 0.5MG/DOS, (OZEMPIC, 0.25 OR 0.5 MG/DOSE,) 2 MG/3ML SOPN Inject 0.5 mg into the skin once a week. Patient taking differently: Inject 1 mg into the skin once a week. 07/28/21   Renato Shin, MD  triamcinolone (NASACORT) 55 MCG/ACT AERO nasal inhaler Place 2 sprays into the nose daily as needed. 04/25/21   [provider]    Allergies as of 02/07/2022   (No Known Allergies)    Family History  Problem Relation Age of Onset   Hypertension Mother    Hypertension Father    Lung cancer Father    Diabetes Sister    Hypertension Sister    Diabetes Sister    Hypertension Sister    Hypertension Brother    Stroke Paternal Grandmother     Social History   Socioeconomic History   Marital status: Single  Spouse name: Not on file   Number of children: 0   Years of education: 32   Highest education level: Master's degree (e.g., MA, MS, MEng, MEd, MSW, MBA)  Occupational History   Not on file  Tobacco Use   Smoking status: Former    Types: Cigars   Smokeless tobacco: Never   Tobacco comments:    No cigars in over 1 year  Vaping Use   Vaping Use: Never used  Substance and Sexual Activity   Alcohol use: Yes    Comment: Few drinks every few months.   Drug use: Never   Sexual activity: Not Currently    Partners: Female  Other Topics Concern   Not on file  Social History Narrative   Not on file   Social Determinants of Health   Financial Resource Strain:  Not on file  Food Insecurity: No Food Insecurity (02/05/2022)   Hunger Vital Sign    Worried About Running Out of Food in the Last Year: Never true    Ran Out of Food in the Last Year: Never true  Transportation Needs: No Transportation Needs (02/05/2022)   PRAPARE - Hydrologist (Medical): No    Lack of Transportation (Non-Medical): No  Physical Activity: Not on file  Stress: Not on file  Social Connections: Not on file  Intimate Partner Violence: Not At Risk (02/05/2022)   Humiliation, Afraid, Rape, and Kick questionnaire    Fear of Current or Ex-Partner: No    Emotionally Abused: No    Physically Abused: No    Sexually Abused: No    Review of Systems: See HPI, otherwise negative ROS  Physical Exam: BP (!) 201/99   Pulse 81   Temp (!) 97.1 F (36.2 C) (Temporal)   Resp 16   Ht _0  (1.6 m)   Wt 104.3 kg   SpO2 98%   BMI 40.74 kg/m  General:   Alert,  pleasant and cooperative in NAD Head:  Normocephalic and atraumatic. Neck:  Supple; no masses or thyromegaly. Lungs:  Clear throughout to auscultation.    Heart:  Regular rate and rhythm. Abdomen:  Soft, nontender and nondistended. Normal bowel sounds, without guarding, and without rebound.   Neurologic:  Alert and  oriented x4;  grossly normal neurologically.  Impression/Plan: David Klein is here for an colonoscopy to be performed for colon cancer screening  Risks, benefits, limitations, and alternatives regarding  colonoscopy have been reviewed with the patient.  Questions have been answered.  All parties agreeable.   Sherri Sear, MD  03/07/2022, 7:54 AM

## 2022-03-07 NOTE — Op Note (Signed)
[facility] Patient Name: David Klein Procedure Date: 03/07/2022 8:30 AM MRN: [id number] Account #: [id number] Date of Birth: 12-11-1970 Admit Type: Outpatient Age: 51 Room: [facility] ENDO ROOM 4 Gender: Male Note Status: Finalized Instrument Name: Jasper Riling [id number] Procedure:             Colonoscopy Indications:           Screening for colorectal malignant neoplasm, This is                         the patient's first colonoscopy Providers:             Lin Landsman MD, MD Medicines:             General Anesthesia Complications:         No immediate complications. Estimated blood loss: None. Procedure:             Pre-Anesthesia Assessment:                        - Prior to the procedure, a History and Physical was                         performed, and patient medications and allergies were                         reviewed. The patient is competent. The risks and                         benefits of the procedure and the sedation options and                         risks were discussed with the patient. All questions                         were answered and informed consent was obtained.                         Patient identification and proposed procedure were                         verified by the physician, the nurse, the                         anesthesiologist, the anesthetist and the technician                         in the pre-procedure area in the procedure room in the                         endoscopy suite. Mental Status Examination: alert and                         oriented. Airway Examination: normal oropharyngeal                         airway and neck mobility. Respiratory Examination:                         clear to auscultation.  CV Examination: normal.                         Prophylactic Antibiotics: The patient does not require                         prophylactic antibiotics. Prior Anticoagulants: The                          patient has taken no anticoagulant or antiplatelet                         agents. ASA Grade Assessment: III - A patient with                         severe systemic disease. After reviewing the risks and                         benefits, the patient was deemed in satisfactory                         condition to undergo the procedure. The anesthesia                         plan was to use general anesthesia. Immediately prior                         to administration of medications, the patient was                         re-assessed for adequacy to receive sedatives. The                         heart rate, respiratory rate, oxygen saturations,                         blood pressure, adequacy of pulmonary ventilation, and                         response to care were monitored throughout the                         procedure. The physical status of the patient was                         re-assessed after the procedure.                        After obtaining informed consent, the colonoscope was                         passed under direct vision. Throughout the procedure,                         the patient's blood pressure, pulse, and oxygen                         saturations were monitored continuously. The  Colonoscope was introduced through the anus and                         advanced to the the cecum, identified by appendiceal                         orifice and ileocecal valve. The colonoscopy was                         performed without difficulty. The patient tolerated                         the procedure well. The quality of the bowel                         preparation was evaluated using the BBPS [facility] Bowel                         Preparation Scale) with scores of: Right Colon = 3,                         Transverse Colon = 3 and Left Colon = 3 (entire mucosa                         seen well with no residual staining, small fragments                          of stool or opaque liquid). The total BBPS score                         equals 9. The ileocecal valve, appendiceal orifice,                         and rectum were photographed. Findings:      The perianal and digital rectal examinations were normal. Pertinent       negatives include normal sphincter tone and no palpable rectal lesions.      The entire examined colon appeared normal.      The retroflexed view of the distal rectum and anal verge was normal and       showed no anal or rectal abnormalities. Impression:            - The entire examined colon is normal.                        - The distal rectum and anal verge are normal on                         retroflexion view.                        - No specimens collected. Recommendation:        - Discharge patient to home (with escort).                        - Resume previous diet today.                        -  Continue present medications.                        - Repeat colonoscopy in 10 years for screening                         purposes. Procedure Code(s):     --- Professional ---                        W4097, Colorectal cancer screening; colonoscopy on                         individual not meeting criteria for high risk Diagnosis Code(s):     --- Professional ---                        Z12.11, Encounter for screening for malignant neoplasm                         of colon CPT copyright 2022 American Medical Association. All rights reserved. The codes documented in this report are preliminary and upon coder review may  be revised to meet current compliance requirements. Dr. Ulyess Mort Lin Landsman MD, MD 03/07/2022 8:57:26 AM This report has been signed electronically. Number of Addenda: 0 Note Initiated On: 03/07/2022 8:30 AM Scope Withdrawal Time: 0 hours 9 minutes 27 seconds  Total Procedure Duration: 0 hours 11 minutes 55 seconds  Estimated Blood Loss:  Estimated blood loss: none.       [facility]

## 2022-03-07 NOTE — Anesthesia Preprocedure Evaluation (Signed)
Anesthesia Evaluation  Patient identified by MRN, date of birth, ID band Patient awake    Reviewed: Allergy & Precautions, H&P , NPO status , Patient's Chart, lab work & pertinent test results, reviewed documented beta blocker date and time   History of Anesthesia Complications Negative for: history of anesthetic complications  Airway Mallampati: III  TM Distance: >3 FB Neck ROM: full    Dental  (+) Dental Advidsory Given   Pulmonary neg shortness of breath, sleep apnea and Continuous Positive Airway Pressure Ventilation , neg COPD, neg recent URI, former smoker   Pulmonary exam normal breath sounds clear to auscultation       Cardiovascular Exercise Tolerance: Good hypertension, (-) angina (-) Past MI and (-) Cardiac Stents Normal cardiovascular exam(-) dysrhythmias (-) Valvular Problems/Murmurs Rhythm:regular Rate:Normal     Neuro/Psych  PSYCHIATRIC DISORDERS Anxiety     negative neurological ROS     GI/Hepatic negative GI ROS, Neg liver ROS,,,  Endo/Other  diabetes  Morbid obesity  Renal/GU CRFRenal disease  negative genitourinary   Musculoskeletal   Abdominal   Peds  Hematology negative hematology ROS (+)   Anesthesia Other Findings Past Medical History: No date: Allergy No date: Diabetes mellitus without complication ([facility]) No date: Hyperlipidemia No date: Hypertension No date: Sleep apnea   Reproductive/Obstetrics negative OB ROS                             Anesthesia Physical Anesthesia Plan  ASA: 3  Anesthesia Plan: General   Post-op Pain Management:    Induction: Intravenous  PONV Risk Score and Plan: 2 and Propofol infusion and TIVA  Airway Management Planned: Natural Airway and Nasal Cannula  Additional Equipment:   Intra-op Plan:   Post-operative Plan:   Informed Consent: I have reviewed the patients History and Physical, chart, labs and discussed the  procedure including the risks, benefits and alternatives for the proposed anesthesia with the patient or authorized representative who has indicated his/her understanding and acceptance.     Dental Advisory Given  Plan Discussed with: Anesthesiologist, CRNA and Surgeon  Anesthesia Plan Comments:        Anesthesia Quick Evaluation

## 2022-03-07 NOTE — Transfer of Care (Signed)
Immediate Anesthesia Transfer of Care Note  Patient: David Klein  Procedure(s) Performed: COLONOSCOPY WITH PROPOFOL  Patient Location: Endoscopy Unit  Anesthesia Type:General  Level of Consciousness: drowsy  Airway & Oxygen Therapy: Patient Spontanous Breathing  Post-op Assessment: Report given to RN  Post vital signs: Reviewed  Last Vitals:  Vitals Value Taken Time  BP 130/76 03/07/22 0900  Temp    Pulse 89 03/07/22 0901  Resp 15 03/07/22 0901  SpO2 98 % 03/07/22 0901  Vitals shown include unvalidated device data.  Last Pain:  Vitals:   03/07/22 0900  TempSrc:   PainSc: 0-No pain         Complications: No notable events documented.

## 2022-03-08 ENCOUNTER — Encounter: Payer: Self-pay | Admitting: Gastroenterology

## 2022-03-19 NOTE — Anesthesia Postprocedure Evaluation (Signed)
Anesthesia Post Note  Patient: David Klein  Procedure(s) Performed: COLONOSCOPY WITH PROPOFOL  Patient location during evaluation: Endoscopy Anesthesia Type: General Level of consciousness: awake and alert Pain management: pain level controlled Vital Signs Assessment: post-procedure vital signs reviewed and stable Respiratory status: spontaneous breathing, nonlabored ventilation, respiratory function stable and patient connected to nasal cannula oxygen Cardiovascular status: blood pressure returned to baseline and stable Postop Assessment: no apparent nausea or vomiting Anesthetic complications: no   No notable events documented.   Last Vitals:  Vitals:   03/07/22 0706 03/07/22 0900  BP: (!) 201/99 130/76  Pulse: 81   Resp: 16   Temp: (!) 36.2 C (!) (P) 35.8 C  SpO2: 98%     Last Pain:  Vitals:   03/07/22 0907  TempSrc:   PainSc: (P) 0-No pain                 Martha Clan

## 2022-03-24 DIAGNOSIS — G4733 Obstructive sleep apnea (adult) (pediatric): Secondary | ICD-10-CM | POA: Diagnosis not present

## 2022-03-25 DIAGNOSIS — G4733 Obstructive sleep apnea (adult) (pediatric): Secondary | ICD-10-CM | POA: Diagnosis not present

## 2022-03-25 NOTE — Progress Notes (Deleted)
Cardiology Office Note:    Date:  03/25/2022   ID:  David Klein, DOB June 09, 1970, MRN [id number]  PCP:  Montel Culver, MD  [facility] Cardiologist:  None  [facility] Electrophysiologist:  None   Referring MD: Montel Culver, MD   Chief Complaint: 3 month follow-up  History of Present Illness:    David Klein is a 51 y.o. male with a hx of  HFpEF, HTN, DM2, OSA, CKD stage 3 who presents for 3 month follow-up.    Hospitalization in 2022 with hypertensive urgency and hyperosmolar hyperglycemic state complicated by AKI on CKD. Referred to cardiology for BP control.    Patient was seen 04/12/21 and reported intermittent SOB and LLE on torsemide. BP was elevated and heart rate was high. Coreg was added. Echo and renal US were ordered. Echo showed normal LVEF, mild LVH and G2DD. US renal arteries showed no stenosis.    Last seen 11/24/21 and BP was better on Imdur.   Today,  Past Medical History:  Diagnosis Date   Allergy    Diabetes mellitus without complication ([place])    Hyperlipidemia    Hypertension    Sleep apnea     Past Surgical History:  Procedure Laterality Date   COLONOSCOPY WITH PROPOFOL N/A 03/07/2022   Procedure: COLONOSCOPY WITH PROPOFOL;  Surgeon: Lin Landsman, MD;  Location: [facility] ENDOSCOPY;  Service: Gastroenterology;  Laterality: N/A;   TOOTH EXTRACTION     WISDOM TOOTH EXTRACTION Bilateral     Current Medications: No outpatient medications have been marked as taking for the 03/26/22 encounter (Appointment) with Kathlen Mody, Gabriele Loveland H, PA-C.     Allergies:   Patient has no known allergies.   Social History   Socioeconomic History   Marital status: Single    Spouse name: Not on file   Number of children: 0   Years of education: 21   Highest education level: Master's degree (e.g., MA, MS, MEng, MEd, MSW, MBA)  Occupational History   Not on file  Tobacco Use   Smoking status: Former    Types: Cigars   Smokeless tobacco: Never    Tobacco comments:    No cigars in over 1 year  Vaping Use   Vaping Use: Never used  Substance and Sexual Activity   Alcohol use: Yes    Comment: Few drinks every few months.   Drug use: Never   Sexual activity: Not Currently    Partners: Female  Other Topics Concern   Not on file  Social History Narrative   Not on file   Social Determinants of Health   Financial Resource Strain: Not on file  Food Insecurity: No Food Insecurity (02/05/2022)   Hunger Vital Sign    Worried About Running Out of Food in the Last Year: Never true    Ran Out of Food in the Last Year: Never true  Transportation Needs: No Transportation Needs (02/05/2022)   PRAPARE - Hydrologist (Medical): No    Lack of Transportation (Non-Medical): No  Physical Activity: Not on file  Stress: Not on file  Social Connections: Not on file     Family History: The patient's ***family history includes Diabetes in his sister and sister; Hypertension in his brother, father, mother, sister, and sister; Lung cancer in his father; Stroke in his paternal grandmother.  ROS:   Please see the history of present illness.    *** All other systems reviewed and are negative.  EKGs/Labs/Other  Studies Reviewed:    The following studies were reviewed today: ***  EKG:  EKG is *** ordered today.  The ekg ordered today demonstrates ***  Recent Labs: 12/18/2021: ALT 21; BUN 63; Creatinine, Ser 4.23; Potassium 4.2; Sodium 135  Recent Lipid Panel    Component Value Date/Time   CHOL 75 (L) 12/18/2021 0938   TRIG 146 12/18/2021 0938   HDL 36 (L) 12/18/2021 0938   CHOLHDL 2.1 12/18/2021 0938   CHOLHDL 6.0 02/05/2021 0552   VLDL UNABLE TO CALCULATE IF TRIGLYCERIDE OVER 400 mg/dL 02/05/2021 0552   LDLCALC 14 12/18/2021 0938   LDLDIRECT 152.5 (H) 02/05/2021 0552     Risk Assessment/Calculations:   {Does this patient have ATRIAL FIBRILLATION?:[phone]}   Physical Exam:    VS:  There were no  vitals taken for this visit.    Wt Readings from Last 3 Encounters:  03/07/22 230 lb (104.3 kg)  02/05/22 228 lb 9.6 oz (103.7 kg)  01/08/22 230 lb 12.8 oz (104.7 kg)     GEN: *** Well nourished, well developed in no acute distress HEENT: Normal NECK: No JVD; No carotid bruits LYMPHATICS: No lymphadenopathy CARDIAC: ***RRR, no murmurs, rubs, gallops RESPIRATORY:  Clear to auscultation without rales, wheezing or rhonchi  ABDOMEN: Soft, non-tender, non-distended MUSCULOSKELETAL:  No edema; No deformity  SKIN: Warm and dry NEUROLOGIC:  Alert and oriented x 3 PSYCHIATRIC:  Normal affect   ASSESSMENT:    No diagnosis found. PLAN:    In order of problems listed above:  ***  Disposition: Follow up {follow up:[id number]} with ***   Shared Decision Making/Informed Consent   {Are you ordering a CV Procedure (e.g. stress test, cath, DCCV, TEE, etc)?   Press F2        :[id number]}    Signed, Shayne Diguglielmo Ninfa Meeker, PA-C  03/25/2022 8:44 PM    [facility]

## 2022-03-26 ENCOUNTER — Ambulatory Visit: Payer: 59 | Admitting: Medical

## 2022-04-09 ENCOUNTER — Ambulatory Visit: Payer: 59 | Admitting: *Deleted

## 2022-04-11 NOTE — Progress Notes (Deleted)
Cardiology Office Note:    Date:  04/11/2022   ID:  David Klein, DOB 1970-09-15, MRN [id number]  PCP:  Montel Culver, MD  [facility] Cardiologist:  None  [facility] Electrophysiologist:  None   Referring MD: Montel Culver, MD   Chief Complaint: 3 month follow-up  History of Present Illness:    David Klein is a 51 y.o. male with a hx of hx of  HFpEF, HTN, DM2, OSA, CKD stage 3 who presents for 1 month follow-up.    Hospitalization in 2022 with hypertensive urgency and hyperosmolar hyperglycemic state complicated by AKI on CKD. Referred to cardiology for BP control.    Patient was seen 04/12/21 and reported intermittent SOB and LLE on torsemide. BP was elevated and heart rate was high. Coreg was added. Echo and renal US were ordered. Echo showed normal LVEF, mild LVH and G2DD. US renal arteries showed no stenosis.    Seen 09/22/21 and reported resolved LLE off amlodipine. Imdur was added for elevated BP.   Last seen 11/24/21 and was doing better. LLE was better off amlodipine.   Today,   Past Medical History:  Diagnosis Date   Allergy    Diabetes mellitus without complication ([place])    Hyperlipidemia    Hypertension    Sleep apnea     Past Surgical History:  Procedure Laterality Date   COLONOSCOPY WITH PROPOFOL N/A 03/07/2022   Procedure: COLONOSCOPY WITH PROPOFOL;  Surgeon: Lin Landsman, MD;  Location: [facility] ENDOSCOPY;  Service: Gastroenterology;  Laterality: N/A;   TOOTH EXTRACTION     WISDOM TOOTH EXTRACTION Bilateral     Current Medications: No outpatient medications have been marked as taking for the 04/13/22 encounter (Appointment) with Kathlen Mody, Faiza Bansal H, PA-C.     Allergies:   Patient has no known allergies.   Social History   Socioeconomic History   Marital status: Single    Spouse name: Not on file   Number of children: 0   Years of education: 33   Highest education level: Master's degree (e.g., MA, MS, MEng, MEd, MSW, MBA)   Occupational History   Not on file  Tobacco Use   Smoking status: Former    Types: Cigars   Smokeless tobacco: Never   Tobacco comments:    No cigars in over 1 year  Vaping Use   Vaping Use: Never used  Substance and Sexual Activity   Alcohol use: Yes    Comment: Few drinks every few months.   Drug use: Never   Sexual activity: Not Currently    Partners: Female  Other Topics Concern   Not on file  Social History Narrative   Not on file   Social Determinants of Health   Financial Resource Strain: Not on file  Food Insecurity: No Food Insecurity (02/05/2022)   Hunger Vital Sign    Worried About Running Out of Food in the Last Year: Never true    Ran Out of Food in the Last Year: Never true  Transportation Needs: No Transportation Needs (02/05/2022)   PRAPARE - Hydrologist (Medical): No    Lack of Transportation (Non-Medical): No  Physical Activity: Not on file  Stress: Not on file  Social Connections: Not on file     Family History: The patient's family history includes Diabetes in his sister and sister; Hypertension in his brother, father, mother, sister, and sister; Lung cancer in his father; Stroke in his paternal grandmother.  ROS:  Please see the history of present illness.     All other systems reviewed and are negative.  EKGs/Labs/Other Studies Reviewed:    The following studies were reviewed today:   Echo 04/2021  1. Left ventricular ejection fraction, by estimation, is 55 to 60%. The  left ventricle has normal function. The left ventricle has no regional  wall motion abnormalities. There is mild to moderate left ventricular  hypertrophy. Left ventricular diastolic  parameters are consistent with Grade II diastolic dysfunction  (pseudonormalization).   2. Right ventricular systolic function is normal. The right ventricular  size is normal. Tricuspid regurgitation signal is inadequate for assessing  PA pressure.   3. Left  atrial size was mildly dilated.   4. The mitral valve is normal in structure. Mild mitral valve  regurgitation. No evidence of mitral stenosis.   5. The aortic valve was not well visualized. Aortic valve regurgitation  is not visualized. No aortic stenosis is present.   6. The inferior vena cava is normal in size with greater than 50%  respiratory variability, suggesting right atrial pressure of 3 mmHg.    Renal US 04/2021 Right: Normal size right kidney. Normal right Resisitive Index.         Abnormal cortical thickness of right kidney. No evidence of         right renal artery stenosis. RRV flow present.  Left:  LRV flow present. No evidence of left renal artery stenosis.         Normal size of left kidney. Normal left Resistive Index.         Abnormal cortical thickness of the left kidney.  Mesenteric:  Normal Celiac artery and Superior Mesenteric artery findings.  Ectatic aorta.   EKG:  EKG is *** ordered today.  The ekg ordered today demonstrates ***  Recent Labs: 12/18/2021: ALT 21; BUN 63; Creatinine, Ser 4.23; Potassium 4.2; Sodium 135  Recent Lipid Panel    Component Value Date/Time   CHOL 75 (L) 12/18/2021 0938   TRIG 146 12/18/2021 0938   HDL 36 (L) 12/18/2021 0938   CHOLHDL 2.1 12/18/2021 0938   CHOLHDL 6.0 02/05/2021 0552   VLDL UNABLE TO CALCULATE IF TRIGLYCERIDE OVER 400 mg/dL 02/05/2021 0552   LDLCALC 14 12/18/2021 0938   LDLDIRECT 152.5 (H) 02/05/2021 0552     Risk Assessment/Calculations:   {Does this patient have ATRIAL FIBRILLATION?:[phone]}   Physical Exam:    VS:  There were no vitals taken for this visit.    Wt Readings from Last 3 Encounters:  03/07/22 230 lb (104.3 kg)  02/05/22 228 lb 9.6 oz (103.7 kg)  01/08/22 230 lb 12.8 oz (104.7 kg)     GEN: *** Well nourished, well developed in no acute distress HEENT: Normal NECK: No JVD; No carotid bruits LYMPHATICS: No lymphadenopathy CARDIAC: ***RRR, no murmurs, rubs, gallops RESPIRATORY:   Clear to auscultation without rales, wheezing or rhonchi  ABDOMEN: Soft, non-tender, non-distended MUSCULOSKELETAL:  No edema; No deformity  SKIN: Warm and dry NEUROLOGIC:  Alert and oriented x 3 PSYCHIATRIC:  Normal affect   ASSESSMENT:    No diagnosis found. PLAN:    In order of problems listed above:  HFpEF LLE  Uncontrolled HTN  CKD stage 3-4  Obesity  OSA    Disposition: Follow up {follow up:15908} with ***   Shared Decision Making/Informed Consent   {Are you ordering a CV Procedure (e.g. stress test, cath, DCCV, TEE, etc)?   Press F2        :  [id number]}    Signed, Arush Gatliff Ninfa Meeker, PA-C  04/11/2022 12:46 PM    [facility]

## 2022-04-13 ENCOUNTER — Ambulatory Visit: Payer: 59 | Admitting: Medical

## 2022-04-23 DIAGNOSIS — G4733 Obstructive sleep apnea (adult) (pediatric): Secondary | ICD-10-CM | POA: Diagnosis not present

## 2022-04-24 DIAGNOSIS — G4733 Obstructive sleep apnea (adult) (pediatric): Secondary | ICD-10-CM | POA: Diagnosis not present

## 2022-04-27 ENCOUNTER — Ambulatory Visit: Payer: 59 | Admitting: *Deleted

## 2022-05-03 ENCOUNTER — Other Ambulatory Visit: Payer: Self-pay | Admitting: Family Medicine

## 2022-05-03 DIAGNOSIS — F411 Generalized anxiety disorder: Secondary | ICD-10-CM

## 2022-05-03 NOTE — Telephone Encounter (Signed)
Requested Prescriptions  Pending Prescriptions Disp Refills   sertraline (ZOLOFT) 50 MG tablet [Pharmacy Med Name: SERTRALINE HCL 50 MG TABLET] 90 tablet 1    Sig: TAKE 1 TABLET BY MOUTH EVERY DAY     Psychiatry:  Antidepressants - SSRI - sertraline Passed - 05/03/2022  1:33 AM      Passed - AST in normal range and within 360 days    AST  Date Value Ref Range Status  12/18/2021 24 0 - 40 IU/L Final         Passed - ALT in normal range and within 360 days    ALT  Date Value Ref Range Status  12/18/2021 21 0 - 44 IU/L Final         Passed - Completed PHQ-2 or PHQ-9 in the last 360 days      Passed - Valid encounter within last 6 months    Recent Outpatient Visits           2 months ago GAD (generalized anxiety disorder)   [place] [facility] at [place], Earley Abide, MD   4 months ago GAD (generalized anxiety disorder)   [place] [facility] at [place], Earley Abide, MD   8 months ago Hypertension, unspecified type   [facility] Primary Care and Sports Medicine at [place], Earley Abide, MD   11 months ago Type 2 diabetes mellitus with hyperosmolar hyperglycemic state (HHS) [facility])   [place] [facility] at [place], Earley Abide, MD   1 year ago Hypertension, unspecified type   [facility] Primary Care and Sports Medicine at [name], Earley Abide, MD       Future Appointments             In 6 days Zigmund Daniel, Earley Abide, MD [facility] Primary Care and Sports Medicine at [facility], [facility]   In 2 weeks Kathlen Mody, Cadence H, PA-C [place]. [place]

## 2022-05-07 DIAGNOSIS — Z6841 Body Mass Index (BMI) 40.0 and over, adult: Secondary | ICD-10-CM | POA: Diagnosis not present

## 2022-05-07 DIAGNOSIS — E119 Type 2 diabetes mellitus without complications: Secondary | ICD-10-CM | POA: Diagnosis not present

## 2022-05-07 DIAGNOSIS — I10 Essential (primary) hypertension: Secondary | ICD-10-CM | POA: Diagnosis not present

## 2022-05-07 DIAGNOSIS — G4733 Obstructive sleep apnea (adult) (pediatric): Secondary | ICD-10-CM | POA: Diagnosis not present

## 2022-05-09 ENCOUNTER — Ambulatory Visit: Payer: 59 | Admitting: Family Medicine

## 2022-05-09 ENCOUNTER — Encounter: Payer: Self-pay | Admitting: Family Medicine

## 2022-05-09 ENCOUNTER — Other Ambulatory Visit: Payer: Self-pay | Admitting: Family Medicine

## 2022-05-09 VITALS — BP 136/84 | HR 78 | Ht 63.0 in | Wt 229.4 lb

## 2022-05-09 DIAGNOSIS — G4733 Obstructive sleep apnea (adult) (pediatric): Secondary | ICD-10-CM | POA: Diagnosis not present

## 2022-05-09 DIAGNOSIS — I10 Essential (primary) hypertension: Secondary | ICD-10-CM

## 2022-05-09 DIAGNOSIS — R69 Illness, unspecified: Secondary | ICD-10-CM | POA: Diagnosis not present

## 2022-05-09 DIAGNOSIS — E1122 Type 2 diabetes mellitus with diabetic chronic kidney disease: Secondary | ICD-10-CM | POA: Diagnosis not present

## 2022-05-09 DIAGNOSIS — F411 Generalized anxiety disorder: Secondary | ICD-10-CM

## 2022-05-09 DIAGNOSIS — N184 Chronic kidney disease, stage 4 (severe): Secondary | ICD-10-CM | POA: Diagnosis not present

## 2022-05-09 DIAGNOSIS — Z794 Long term (current) use of insulin: Secondary | ICD-10-CM

## 2022-05-09 LAB — POCT GLYCOSYLATED HEMOGLOBIN (HGB A1C): Hemoglobin A1C: 7.1 % — AB (ref 4.0–5.6)

## 2022-05-09 MED ORDER — SERTRALINE HCL 25 MG PO TABS: 75.0000 mg | ORAL_TABLET | Freq: Every day | ORAL | 2 refills | Status: DC

## 2022-05-09 MED ORDER — FREESTYLE LIBRE 2 SENSOR MISC: 1.0000 | 3 refills | Status: AC

## 2022-05-09 NOTE — Assessment & Plan Note (Signed)
Interim A1c decrease noted, tolerating Ozempic 1 mg well, sees endocrinology early next month.  He is planning on incorporating more vegetables into his diet, has upcoming nutritionist visit as well which can help him in this objective.

## 2022-05-09 NOTE — Progress Notes (Signed)
Primary Care / Sports Medicine Office Visit  Patient Information:  Patient ID: David Klein, male DOB: Dec 21, 1970 Age: 52 y.o. MRN: [id number]   David Klein is a pleasant 52 y.o. male presenting with the following:  Chief Complaint  Patient presents with   GAD   Diabetes   Hypertension    Vitals:   05/09/22 0758  BP: 136/84  Pulse: 78  SpO2: 98%   Vitals:   05/09/22 0758  Weight: 229 lb 6.4 oz (104.1 kg)  Height: 5\' 3"  (1.6 m)   Body mass index is 40.64 kg/m.  No results found.   Independent interpretation of notes and tests performed by another provider:   None  Procedures performed:   None  Pertinent History, Exam, Impression, and Recommendations:   David Klein was seen today for gad, diabetes and hypertension.  Type 2 diabetes mellitus with stage 4 chronic kidney disease, with long-term current use of insulin (HCC) -     POCT glycosylated hemoglobin (Hb A1C)  GAD (generalized anxiety disorder) Overview:    05/09/2022    8:07 AM 02/05/2022   10:52 AM 01/08/2022    8:53 AM 12/06/2021   10:41 AM 08/14/2021   10:27 AM  Depression screen PHQ 2/9  Decreased Interest 0 0 0 0 0  Down, Depressed, Hopeless 0 0 0 0 0  PHQ - 2 Score 0 0 0 0 0  Altered sleeping 1 2  2 1   Tired, decreased energy 1 1  1 1   Change in appetite 0 1  1 0  Feeling bad or failure about yourself  0 0  0 0  Trouble concentrating 0 0  0 0  Moving slowly or fidgety/restless 0 0  0 0  Suicidal thoughts 0 0  0 0  PHQ-9 Score 2 4  4 2   Difficult doing work/chores  Somewhat difficult  Not difficult at all       05/09/2022    8:07 AM 02/05/2022   10:53 AM 12/06/2021   10:41 AM 08/14/2021   10:28 AM  GAD 7 : Generalized Anxiety Score  Nervous, Anxious, on Edge 0 0 0 0  Control/stop worrying 0 0 0 0  Worry too much - different things 0 0 0 1  Trouble relaxing 0 0 0 0  Restless 0 0 0 0  Easily annoyed or irritable 0 0 0 0  Afraid - awful might happen 0 0 0 0  Total GAD 7 Score 0 0 0  1  Anxiety Difficulty Not difficult at all Not difficult at all  Not difficult at all      Assessment & Plan: No longer describing issues with sleep initiation, has been tolerating the increased dose of sertraline 50 mg daily without adverse effects.  Nighttime awakenings have persisted, did have a chance to establish with sleep medicine over the interim.  Given his reassuring scores I discussed medication options, patient is requesting to further titrate to 75 mg, new Rx provided today.  He is to contact David Klein for any further medication change request over the interim between now and his physical during the August 2024 timeframe.  Orders: -     Sertraline HCl; Take 3 tablets (75 mg total) by mouth daily.  Dispense: 90 tablet; Refill: 2  OSA on CPAP Assessment & Plan: Did have a chance over the interim to establish with sleep medicine, no CPAP adjustments described by patient, was advised heavy focus on sleep hygiene, limiting nighttime fluids as  a major component of his nighttime awakening involves having to use the restroom.  He has not had an opportunity to make these changes yet but plans to do so.  Will continue to follow this issue peripherally.   Hypertension, unspecified type Assessment & Plan: Chronic, stable, follows with cardiology.   Other orders -     FreeStyle Libre 2 Sensor; 1 Device by Does not apply route every 14 (fourteen) days.  Dispense: 6 each; Refill: 3     Orders & Medications Meds ordered this encounter  Medications   Continuous Blood Gluc Sensor (FREESTYLE LIBRE 2 SENSOR) MISC    Sig: 1 Device by Does not apply route every 14 (fourteen) days.    Dispense:  6 each    Refill:  3   sertraline (ZOLOFT) 25 MG tablet    Sig: Take 3 tablets (75 mg total) by mouth daily.    Dispense:  90 tablet    Refill:  2   Orders Placed This Encounter  Procedures   POCT glycosylated hemoglobin (Hb A1C)     Return in about 7 months (around 12/08/2022) for CPE.      David Culver, MD, [facility]   Primary Care Sports Medicine Primary Care and Sports Medicine at [facility]

## 2022-05-09 NOTE — Assessment & Plan Note (Signed)
Chronic, stable, follows with cardiology.

## 2022-05-09 NOTE — Patient Instructions (Signed)
-   Increase sertraline to 75 mg daily (3 tablets of the 25 mg strength) - If noting any issues during the February timeframe, contact our office for medication adjustment steps - Continue healthy lifestyle changes and can discuss incorporating more vegetables with nutritionist at your follow-up - Incorporate changes advised by sleep medicine involving sleep hygiene and discuss water intake/nighttime restroom use with endocrinology when you see them next month - Return for annual physical August 2024

## 2022-05-09 NOTE — Assessment & Plan Note (Signed)
No longer describing issues with sleep initiation, has been tolerating the increased dose of sertraline 50 mg daily without adverse effects.  Nighttime awakenings have persisted, did have a chance to establish with sleep medicine over the interim.  Given his reassuring scores I discussed medication options, patient is requesting to further titrate to 75 mg, new Rx provided today.  He is to contact us for any further medication change request over the interim between now and his physical during the August 2024 timeframe.

## 2022-05-09 NOTE — Assessment & Plan Note (Signed)
Did have a chance over the interim to establish with sleep medicine, no CPAP adjustments described by patient, was advised heavy focus on sleep hygiene, limiting nighttime fluids as a major component of his nighttime awakening involves having to use the restroom.  He has not had an opportunity to make these changes yet but plans to do so.  Will continue to follow this issue peripherally.

## 2022-05-09 NOTE — Telephone Encounter (Signed)
Unable to refill per protocol, Rx expired. Medication was discontinued 01/08/22, change in therapy. Will refuse.  Requested Prescriptions  Pending Prescriptions Disp Refills   losartan (COZAAR) 25 MG tablet [Pharmacy Med Name: LOSARTAN POTASSIUM 25 MG TAB] 90 tablet 0    Sig: TAKE 1 TABLET (25 MG TOTAL) BY MOUTH DAILY.     Cardiovascular:  Angiotensin Receptor Blockers Failed - 05/09/2022  1:31 AM      Failed - Cr in normal range and within 180 days    Creatinine, Ser  Date Value Ref Range Status  12/18/2021 4.23 (H) 0.76 - 1.27 mg/dL Final   Creatinine, Urine  Date Value Ref Range Status  02/06/2021 73 mg/dL Final         Passed - K in normal range and within 180 days    Potassium  Date Value Ref Range Status  12/18/2021 4.2 3.5 - 5.2 mmol/L Final         Passed - Patient is not pregnant      Passed - Last BP in normal range    BP Readings from Last 1 Encounters:  05/09/22 136/84         Passed - Valid encounter within last 6 months    Recent Outpatient Visits           Today Type 2 diabetes mellitus with stage 4 chronic kidney disease, with long-term current use of insulin ([place])   [place] [facility] at [place], Earley Abide, MD   3 months ago GAD (generalized anxiety disorder)   [place] [facility] at [place], Earley Abide, MD   5 months ago GAD (generalized anxiety disorder)   [place] [facility] at [place], Earley Abide, MD   8 months ago Hypertension, unspecified type   [facility] Primary Care and Sports Medicine at [facility], Earley Abide, MD   11 months ago Type 2 diabetes mellitus with hyperosmolar hyperglycemic state (HHS) [facility])   [place] [facility] at [place], MD       Future Appointments             In 1 week Kathlen Mody, Cadence H, PA-C [place]. [place]   In 7 months Zigmund Daniel, Earley Abide, MD [place] and Sports Medicine at [facility], [facility]

## 2022-05-21 ENCOUNTER — Ambulatory Visit: Payer: 59 | Attending: Medical | Admitting: Medical

## 2022-05-21 ENCOUNTER — Encounter: Payer: Self-pay | Admitting: Medical

## 2022-05-21 VITALS — BP 194/74 | HR 82 | Ht 63.0 in | Wt 229.6 lb

## 2022-05-21 DIAGNOSIS — I5032 Chronic diastolic (congestive) heart failure: Secondary | ICD-10-CM

## 2022-05-21 DIAGNOSIS — G4733 Obstructive sleep apnea (adult) (pediatric): Secondary | ICD-10-CM | POA: Diagnosis not present

## 2022-05-21 DIAGNOSIS — I10 Essential (primary) hypertension: Secondary | ICD-10-CM

## 2022-05-21 DIAGNOSIS — N183 Chronic kidney disease, stage 3 unspecified: Secondary | ICD-10-CM

## 2022-05-21 MED ORDER — ISOSORBIDE MONONITRATE ER 60 MG PO TB24: 60.0000 mg | ORAL_TABLET | Freq: Every day | ORAL | 0 refills | Status: DC

## 2022-05-21 NOTE — Progress Notes (Signed)
Cardiology Office Note:    Date:  05/21/2022   ID:  David Klein, DOB 1971-03-22, MRN [id number]  PCP:  Montel Culver, MD  [facility] Cardiologist:  None  [facility] Electrophysiologist:  None   Referring MD: Montel Culver, MD   Chief Complaint: 6 month follow-up  History of Present Illness:    David Klein is a 52 y.o. male with a hx of  HFpEF, HTN, DM2, OSA, CKD stage 3 who presents for 1 month follow-up.    Hospitalization in 2022 with hypertensive urgency and hyperosmolar hyperglycemic state complicated by AKI on CKD. Referred to cardiology for BP control.    Patient was seen 04/12/21 and reported intermittent SOB and LLE on torsemide. BP was elevated and heart rate was high. Coreg was added. Echo and renal US were ordered. Echo showed normal LVEF, mild LVH and G2DD. US renal arteries showed no stenosis.    The patient was seen in follow-up 09/22/21 and reported resolved LLE off amlodipine. Imdur was added for elevated BP.   Last seen 11/24/21 and BP was better.   Today, BP is high. He says it has been mildly elevated at home. He has decreased sat intake. He is making other healthy lifestyle changes such as exercising 30 minutes a day. He denies chest pain, SOB, lower leg edema, orthopnea or pnd. Re-check BP 180/92.   Past Medical History:  Diagnosis Date   Allergy    Diabetes mellitus without complication ([place])    Hyperlipidemia    Hypertension    Sleep apnea     Past Surgical History:  Procedure Laterality Date   COLONOSCOPY WITH PROPOFOL N/A 03/07/2022   Procedure: COLONOSCOPY WITH PROPOFOL;  Surgeon: Lin Landsman, MD;  Location: [facility] ENDOSCOPY;  Service: Gastroenterology;  Laterality: N/A;   TOOTH EXTRACTION     WISDOM TOOTH EXTRACTION Bilateral     Current Medications: Current Meds  Medication Sig   aspirin 81 MG EC tablet Take 1 tablet (81 mg total) by mouth daily.   blood glucose meter kit and supplies KIT Dispense based on  patient and insurance preference. Use up to four times daily as directed. (Patient taking differently: Inject 1 each into the skin See admin instructions. Dispense based on patient and insurance preference. Use up to four times daily as directed.)   carvedilol (COREG) 25 MG tablet Take 1 tablet (25 mg total) by mouth 2 (two) times daily.   Continuous Blood Gluc Sensor (FREESTYLE LIBRE 2 SENSOR) MISC 1 Device by Does not apply route every 14 (fourteen) days.   diphenhydrAMINE HCl (BENADRYL ALLERGY PO) Take 1 tablet by mouth 2 (two) times daily as needed (allergies).   hydrALAZINE (APRESOLINE) 100 MG tablet Take 100 mg by mouth 3 (three) times daily.   insulin glargine (LANTUS SOLOSTAR) 100 UNIT/ML Solostar Pen Inject 40 Units into the skin every morning. And pen needles 1/day.   Insulin Pen Needle (PEN NEEDLES) 31G X 5 MM MISC 15 Units by Does not apply route at bedtime.   isosorbide mononitrate (IMDUR) 60 MG 24 hr tablet Take 1 tablet (60 mg total) by mouth daily.   losartan (COZAAR) 25 MG tablet Take 25 mg by mouth daily.   rosuvastatin (CRESTOR) 40 MG tablet Take 1 tablet (40 mg total) by mouth daily.   Semaglutide, 1 MG/DOSE, (OZEMPIC, 1 MG/DOSE,) 2 MG/1.5ML SOPN Inject 1 mg into the skin once a week.   sertraline (ZOLOFT) 25 MG tablet Take 3 tablets (75 mg total)  by mouth daily.   torsemide (DEMADEX) 20 MG tablet Take 20 mg by mouth 2 (two) times daily.   triamcinolone (NASACORT) 55 MCG/ACT AERO nasal inhaler Place 2 sprays into the nose daily as needed.   [DISCONTINUED] isosorbide mononitrate (IMDUR) 30 MG 24 hr tablet Take 1 tablet (30 mg total) by mouth daily.     Allergies:   Patient has no known allergies.   Social History   Socioeconomic History   Marital status: Single    Spouse name: Not on file   Number of children: 0   Years of education: 43   Highest education level: Master's degree (e.g., MA, MS, MEng, MEd, MSW, MBA)  Occupational History   Not on file  Tobacco Use    Smoking status: Former    Types: Cigars   Smokeless tobacco: Never   Tobacco comments:    No cigars in over 1 year  Vaping Use   Vaping Use: Never used  Substance and Sexual Activity   Alcohol use: Yes    Comment: Few drinks every few months.   Drug use: Never   Sexual activity: Not Currently    Partners: Female  Other Topics Concern   Not on file  Social History Narrative   Not on file   Social Determinants of Health   Financial Resource Strain: Not on file  Food Insecurity: No Food Insecurity (02/05/2022)   Hunger Vital Sign    Worried About Running Out of Food in the Last Year: Never true    Ran Out of Food in the Last Year: Never true  Transportation Needs: No Transportation Needs (02/05/2022)   PRAPARE - Hydrologist (Medical): No    Lack of Transportation (Non-Medical): No  Physical Activity: Not on file  Stress: Not on file  Social Connections: Not on file     Family History: The patient's family history includes Diabetes in his sister and sister; Hypertension in his brother, father, mother, sister, and sister; Lung cancer in his father; Stroke in his paternal grandmother.  ROS:   Please see the history of present illness.     All other systems reviewed and are negative.  EKGs/Labs/Other Studies Reviewed:    The following studies were reviewed today:  Echo 04/2021  1. Left ventricular ejection fraction, by estimation, is 55 to 60%. The  left ventricle has normal function. The left ventricle has no regional  wall motion abnormalities. There is mild to moderate left ventricular  hypertrophy. Left ventricular diastolic  parameters are consistent with Grade II diastolic dysfunction  (pseudonormalization).   2. Right ventricular systolic function is normal. The right ventricular  size is normal. Tricuspid regurgitation signal is inadequate for assessing  PA pressure.   3. Left atrial size was mildly dilated.   4. The mitral valve is  normal in structure. Mild mitral valve  regurgitation. No evidence of mitral stenosis.   5. The aortic valve was not well visualized. Aortic valve regurgitation  is not visualized. No aortic stenosis is present.   6. The inferior vena cava is normal in size with greater than 50%  respiratory variability, suggesting right atrial pressure of 3 mmHg.    Renal US 04/2021 Right: Normal size right kidney. Normal right Resisitive Index.         Abnormal cortical thickness of right kidney. No evidence of         right renal artery stenosis. RRV flow present.  Left:  LRV flow present. No  evidence of left renal artery stenosis.         Normal size of left kidney. Normal left Resistive Index.         Abnormal cortical thickness of the left kidney.  Mesenteric:  Normal Celiac artery and Superior Mesenteric artery findings.  Ectatic aorta.   EKG:  EKG is ordered today.  The ekg ordered today demonstrates NSR 82bpm, no ST/T wave changes  Recent Labs: 12/18/2021: ALT 21; BUN 63; Creatinine, Ser 4.23; Potassium 4.2; Sodium 135  Recent Lipid Panel    Component Value Date/Time   CHOL 75 (L) 12/18/2021 0938   TRIG 146 12/18/2021 0938   HDL 36 (L) 12/18/2021 0938   CHOLHDL 2.1 12/18/2021 0938   CHOLHDL 6.0 02/05/2021 0552   VLDL UNABLE TO CALCULATE IF TRIGLYCERIDE OVER 400 mg/dL 02/05/2021 0552   LDLCALC 14 12/18/2021 0938   LDLDIRECT 152.5 (H) 02/05/2021 0552     Physical Exam:    VS:  BP (!) 194/74 (BP Location: Left Arm, Patient Position: Sitting, Cuff Size: Normal)   Pulse 82   Ht 5\' 3"  (1.6 m)   Wt 229 lb 9.6 oz (104.1 kg)   SpO2 95%   BMI 40.67 kg/m     Wt Readings from Last 3 Encounters:  05/21/22 229 lb 9.6 oz (104.1 kg)  05/09/22 229 lb 6.4 oz (104.1 kg)  03/07/22 230 lb (104.3 kg)     GEN:  Well nourished, well developed in no acute distress HEENT: Normal NECK: No JVD; No carotid bruits LYMPHATICS: No lymphadenopathy CARDIAC: RRR, no murmurs, rubs, gallops RESPIRATORY:   Clear to auscultation without rales, wheezing or rhonchi  ABDOMEN: Soft, non-tender, non-distended MUSCULOSKELETAL:  No edema; No deformity  SKIN: Warm and dry NEUROLOGIC:  Alert and oriented x 3 PSYCHIATRIC:  Normal affect   ASSESSMENT:    1. Chronic diastolic heart failure ([place])   2. Essential hypertension   3. Morbid obesity ([place])   4. Stage 3 chronic kidney disease, unspecified whether stage 3a or 3b CKD ([place])   5. OSA (obstructive sleep apnea)    PLAN:    In order of problems listed above:  HFpEF Patient is euvolemic on exam today. Echo 04/2021 showed LVEF 55-60%, no WMA, mild to mod LVH, G2DD, normal RVSF, mild MR. Continue Coreg, Losartan and current dose of Torsemide 20mg  BID.   HTN BP on re-check 180/94. It has been mildly increased at home. Last week at PCP it was mildly elevated. I will increase  Imdur to 60mg  daily. Continue Coreg 25mg  BID and Hydralazine 100mg  TID.   CKD stage 3-4 Patient follows with nephrology.   Obesity Patient is work-on healthy lifestyle changes.   OSA on CPAP Patient reports compliance with CPAP.   Disposition: Follow up in 3 month(s) with MD/APP    Signed, Chelcee Korpi Ninfa Meeker, PA-C  05/21/2022 12:30 PM    [facility]

## 2022-05-21 NOTE — Patient Instructions (Addendum)
Medication Instructions:  INCREASE isosorbide to 60 mg by mouth daily.  *If you need a refill on your cardiac medications before your next appointment, please call your pharmacy*   Lab Work: No labs ordered  If you have labs (blood work) drawn today and your tests are completely normal, you will receive your results only by: [place] (if you have MyChart) OR A paper copy in the mail If you have any lab test that is abnormal or we need to change your treatment, we will call you to review the results.   Testing/Procedures: No testing ordered  Follow-Up: At [facility], you and your health needs are our priority.  As part of our continuing mission to provide you with exceptional heart care, we have created designated Provider Care Teams.  These Care Teams include your primary Cardiologist (physician) and Advanced Practice Providers (APPs -  Physician Assistants and Nurse Practitioners) who all work together to provide you with the care you need, when you need it.  We recommend signing up for the patient portal called "MyChart".  Sign up information is provided on this After Visit Summary.  MyChart is used to connect with patients for Virtual Visits (Telemedicine).  Patients are able to view lab/test results, encounter notes, upcoming appointments, etc.  Non-urgent messages can be sent to your provider as well.   To learn more about what you can do with MyChart, go to [email or web address].    Your next appointment:   3 month(s)  Provider:   You may see Dr. Harrell Gave End or one of the following Advanced Practice Providers on your designated Care Team:   Murray Hodgkins, NP Christell Faith, PA-C Cadence Kathlen Mody, PA-C Gerrie Nordmann, NP

## 2022-05-25 ENCOUNTER — Encounter: Payer: Self-pay | Admitting: *Deleted

## 2022-05-25 ENCOUNTER — Encounter: Payer: 59 | Attending: Family Medicine | Admitting: *Deleted

## 2022-05-25 VITALS — BP 140/70 | Wt 230.7 lb

## 2022-05-25 DIAGNOSIS — Z713 Dietary counseling and surveillance: Secondary | ICD-10-CM | POA: Diagnosis not present

## 2022-05-25 DIAGNOSIS — E119 Type 2 diabetes mellitus without complications: Secondary | ICD-10-CM | POA: Diagnosis not present

## 2022-05-25 DIAGNOSIS — E1122 Type 2 diabetes mellitus with diabetic chronic kidney disease: Secondary | ICD-10-CM

## 2022-05-25 NOTE — Progress Notes (Signed)
Diabetes Self-Management Education  Visit Type: Follow-up  Appt. Start Time: 0825 Appt. End Time: 0915  05/25/2022  [name], identified by name and date of birth, is a 52 y.o. male with a diagnosis of Diabetes: Type 2   ASSESSMENT  Blood pressure (!) 140/70, weight 230 lb 11.2 oz (104.6 kg). Body mass index is 40.87 kg/m.   Diabetes Self-Management Education - 05/25/22 0926       Visit Information   Visit Type Follow-up      Complications   Last HgB A1C per patient/outside source 7.1 %   05/09/2022   How often do you check your blood sugar? > 4 times/day   FreeStyle Libre - 90 day avg 136 mg/dL Time in Range 90%, 181-240 9%, >240 1%; 30 avg 137 mg/dL Time in Range 89%, 181-240 10%, >240 1%   Number of hypoglycemic episodes per month 1   reports reading in the 60's - alerted by his CGM   Can you tell when your blood sugar is low? Yes    What do you do if your blood sugar is low? has glucose tablets or regular soda    Have you had a dilated eye exam in the past 12 months? No    Have you had a dental exam in the past 12 months? Yes    Are you checking your feet? Yes    How many days per week are you checking your feet? 1      Dietary Intake   Breakfast 3 meals and 2 snacks/day - having protein with meals    Snack (morning) will work on adding protein when having fruit for a snack    Beverage(s) water, Coke zero      Activity / Exercise   Activity / Exercise Type Light (walking / raking leaves)    How many days per week do you exercise? 7    How many minutes per day do you exercise? 30    Total minutes per week of exercise 210      Patient Education   Disease Pathophysiology Explored patient's options for treatment of their diabetes    Healthy Eating Role of diet in the treatment of diabetes and the relationship between the three main macronutrients and blood glucose level;Food label reading, portion sizes and measuring food.;Carbohydrate counting;Reviewed blood  glucose goals for pre and post meals and how to evaluate the patients' food intake on their blood glucose level.;Meal timing in regards to the patients' current diabetes medication.;Information on hints to eating out and maintain blood glucose control.    Being Active Role of exercise on diabetes management, blood pressure control and cardiac health.    Medications Taught/reviewed insulin/injectables, injection, site rotation, insulin/injectables storage and needle disposal.;Reviewed patients medication for diabetes, action, purpose, timing of dose and side effects.    Monitoring Taught/evaluated CGM (comment);Taught/discussed recording of test results and interpretation of SMBG.;Identified appropriate SMBG and/or A1C goals.;Yearly dilated eye exam    Acute complications Taught prevention, symptoms, and  treatment of hypoglycemia - the 15 rule.    Chronic complications Relationship between chronic complications and blood glucose control    Diabetes Stress and Support Identified and addressed patients feelings and concerns about diabetes      Individualized Goals (developed by patient)   Nutrition Follow meal plan discussed    Physical Activity Exercise 5-7 days per week;30 minutes per day    Monitoring  Consistenly use CGM    Reducing Risk treat hypoglycemia with 15 grams  of carbs if blood glucose less than 70mg /dL      Post-Education Assessment   Patient understands the diabetes disease and treatment process. Demonstrates understanding / competency    Patient understands incorporating nutritional management into lifestyle. Demonstrates understanding / competency    Patient undertands incorporating physical activity into lifestyle. Comprehends key points    Patient understands using medications safely. Demonstrates understanding / competency    Patient understands monitoring blood glucose, interpreting and using results Demonstrates understanding / competency    Patient understands prevention,  detection, and treatment of acute complications. Demonstrates understanding / competency    Patient understands prevention, detection, and treatment of chronic complications. Demonstrates understanding / competency    Patient understands how to develop strategies to address psychosocial issues. Demonstrates understanding / competency    Patient understands how to develop strategies to promote health/change behavior. Demonstrates understanding / competency      Outcomes   Expected Outcomes Demonstrated interest in learning. Expect positive outcomes    Program Status Completed      Subsequent Visit   Since your last visit have you continued or begun to take your medications as prescribed? Yes    Since your last visit have you had your blood pressure checked? Yes    Is your most recent blood pressure lower, unchanged, or higher since your last visit? Lower    Since your last visit have you experienced any weight changes? No change    Since your last visit, are you checking your blood glucose at least once a day? Yes         Individualized Plan for Diabetes Self-Management Training:   Learning Objective:  Patient will have a greater understanding of diabetes self-management. Patient education plan is to attend individual and/or group sessions per assessed needs and concerns.   Plan:   Patient Instructions  Check blood sugars at least 4 x day before each meal, 2 hours after meals, before bed and as needed with FreeStyle Libre every day Bring phone to MD appointments for BG records  Exercise:  Continue video exercises for    30  minutes   5  days a week  Eat 3 meals day,   1-2  snacks a day Space meals 4-6 hours apart Include 1 serving of protein when eating fruit for a snack  Make an eye doctor appointment  Carry fast acting glucose and a snack at all times Rotate injection sites  Expected Outcomes:  Demonstrated interest in learning. Expect positive outcomes  Education material  provided:  Planning a Balanced Meal Get Healthy with Mediterranean Eating  If problems or questions, patient to contact team via:   Johny Drilling, RN, CCM, [place] [phone]  Future DSME appointment:  PRN

## 2022-05-25 NOTE — Patient Instructions (Signed)
Check blood sugars at least 4 x day before each meal, 2 hours after meals, before bed and as needed with FreeStyle Libre every day Bring phone to MD appointments for BG records  Exercise:  Continue video exercises for    30  minutes   5  days a week  Eat 3 meals day,   1-2  snacks a day Space meals 4-6 hours apart Include 1 serving of protein when eating fruit for a snack  Make an eye doctor appointment  Carry fast acting glucose and a snack at all times Rotate injection sites

## 2022-06-01 DIAGNOSIS — E1100 Type 2 diabetes mellitus with hyperosmolarity without nonketotic hyperglycemic-hyperosmolar coma (NKHHC): Secondary | ICD-10-CM | POA: Diagnosis not present

## 2022-06-01 DIAGNOSIS — N1831 Chronic kidney disease, stage 3a: Secondary | ICD-10-CM | POA: Diagnosis not present

## 2022-06-01 DIAGNOSIS — R801 Persistent proteinuria, unspecified: Secondary | ICD-10-CM | POA: Diagnosis not present

## 2022-06-01 DIAGNOSIS — N179 Acute kidney failure, unspecified: Secondary | ICD-10-CM | POA: Diagnosis not present

## 2022-06-01 DIAGNOSIS — I10 Essential (primary) hypertension: Secondary | ICD-10-CM | POA: Diagnosis not present

## 2022-06-01 DIAGNOSIS — E1122 Type 2 diabetes mellitus with diabetic chronic kidney disease: Secondary | ICD-10-CM | POA: Diagnosis not present

## 2022-06-05 ENCOUNTER — Other Ambulatory Visit: Payer: Self-pay | Admitting: Family Medicine

## 2022-06-05 DIAGNOSIS — E1122 Type 2 diabetes mellitus with diabetic chronic kidney disease: Secondary | ICD-10-CM

## 2022-06-05 DIAGNOSIS — I10 Essential (primary) hypertension: Secondary | ICD-10-CM

## 2022-06-05 DIAGNOSIS — F411 Generalized anxiety disorder: Secondary | ICD-10-CM

## 2022-06-05 DIAGNOSIS — E785 Hyperlipidemia, unspecified: Secondary | ICD-10-CM

## 2022-06-05 NOTE — Telephone Encounter (Signed)
Requested Prescriptions  Pending Prescriptions Disp Refills   rosuvastatin (CRESTOR) 40 MG tablet [Pharmacy Med Name: ROSUVASTATIN CALCIUM 40 MG TAB] 90 tablet 2    Sig: TAKE 1 TABLET BY MOUTH EVERY DAY     Cardiovascular:  Antilipid - Statins 2 Failed - 06/05/2022  1:32 AM      Failed - Cr in normal range and within 360 days    Creatinine, Ser  Date Value Ref Range Status  12/18/2021 4.23 (H) 0.76 - 1.27 mg/dL Final   Creatinine, Urine  Date Value Ref Range Status  02/06/2021 73 mg/dL Final         Failed - Lipid Panel in normal range within the last 12 months    Cholesterol, Total  Date Value Ref Range Status  12/18/2021 75 (L) 100 - 199 mg/dL Final   LDL Chol Calc (NIH)  Date Value Ref Range Status  12/18/2021 14 0 - 99 mg/dL Final   Direct LDL  Date Value Ref Range Status  02/05/2021 152.5 (H) 0 - 99 mg/dL Final    Comment:    Performed at [facility], [place] [address] [place], [place] [address]   HDL  Date Value Ref Range Status  12/18/2021 36 (L) >39 mg/dL Final   Triglycerides  Date Value Ref Range Status  12/18/2021 146 0 - 149 mg/dL Final         Passed - Patient is not pregnant      Passed - Valid encounter within last 12 months    Recent Outpatient Visits           3 weeks ago Type 2 diabetes mellitus with stage 4 chronic kidney disease, with long-term current use of insulin ([place])   [place] [facility] at [place], Earley Abide, MD   4 months ago GAD (generalized anxiety disorder)   [place] [facility] at [place], Earley Abide, MD   6 months ago GAD (generalized anxiety disorder)   [place] [facility] at [place], Earley Abide, MD   9 months ago Hypertension, unspecified type   [place] at [place], Earley Abide, MD   1 year ago Type 2 diabetes mellitus with hyperosmolar hyperglycemic state (HHS)  [facility])   [place] at [place], MD       Future Appointments             In 2 months Kathlen Mody, Cadence H, PA-C [place] at [place]   In 6 months Zigmund Daniel, Earley Abide, MD [place] at [facility], [facility]

## 2022-06-06 NOTE — Telephone Encounter (Signed)
Requested medication (s) are due for refill today - no  Requested medication (s) are on the active medication list -yes  Future visit scheduled -yes  Last refill: 05/09/22 #90 2RF  Notes to clinic: Request to change original Rx to 90 day supply- request requires changes to original Rx- sent for provider review   Requested Prescriptions  Pending Prescriptions Disp Refills   sertraline (ZOLOFT) 25 MG tablet [Pharmacy Med Name: SERTRALINE HCL 25 MG TABLET] 270 tablet 1    Sig: TAKE 3 TABLETS BY MOUTH DAILY     Psychiatry:  Antidepressants - SSRI - sertraline Passed - 06/05/2022 11:31 AM      Passed - AST in normal range and within 360 days    AST  Date Value Ref Range Status  12/18/2021 24 0 - 40 IU/L Final         Passed - ALT in normal range and within 360 days    ALT  Date Value Ref Range Status  12/18/2021 21 0 - 44 IU/L Final         Passed - Completed PHQ-2 or PHQ-9 in the last 360 days      Passed - Valid encounter within last 6 months    Recent Outpatient Visits           4 weeks ago Type 2 diabetes mellitus with stage 4 chronic kidney disease, with long-term current use of insulin ([place])   [place] [facility] at [place], Earley Abide, MD   4 months ago GAD (generalized anxiety disorder)   [place] [facility] at [place], Earley Abide, MD   6 months ago GAD (generalized anxiety disorder)    [facility] at [place], Earley Abide, MD   9 months ago Hypertension, unspecified type   [facility] at [facility], Earley Abide, MD   1 year ago Type 2 diabetes mellitus with hyperosmolar hyperglycemic state (HHS) [facility])   [place] at [place], MD       Future Appointments             In 2 months [name], Cadence H, PA-C [place] at [place]   In 6  months Zigmund Daniel, Earley Abide, MD [place] at [facility], [facility]               Requested Prescriptions  Pending Prescriptions Disp Refills   sertraline (ZOLOFT) 25 MG tablet [Pharmacy Med Name: SERTRALINE HCL 25 MG TABLET] 270 tablet 1    Sig: TAKE 3 TABLETS BY MOUTH DAILY     Psychiatry:  Antidepressants - SSRI - sertraline Passed - 06/05/2022 11:31 AM      Passed - AST in normal range and within 360 days    AST  Date Value Ref Range Status  12/18/2021 24 0 - 40 IU/L Final         Passed - ALT in normal range and within 360 days    ALT  Date Value Ref Range Status  12/18/2021 21 0 - 44 IU/L Final         Passed - Completed PHQ-2 or PHQ-9 in the last 360 days      Passed - Valid encounter within last 6 months    Recent Outpatient Visits  4 weeks ago Type 2 diabetes mellitus with stage 4 chronic kidney disease, with long-term current use of insulin ([place])   [place] [facility] at [place], Earley Abide, MD   4 months ago GAD (generalized anxiety disorder)   [place] [facility] at [place], Earley Abide, MD   6 months ago GAD (generalized anxiety disorder)   [place] [facility] at [place], Earley Abide, MD   9 months ago Hypertension, unspecified type   [facility] at [place], Earley Abide, MD   1 year ago Type 2 diabetes mellitus with hyperosmolar hyperglycemic state (HHS) [facility])   [place] at [place], MD       Future Appointments             In 2 months Kathlen Mody, Cadence H, PA-C [place] at [place]   In 6 months Zigmund Daniel, Earley Abide, MD [place] at [facility], [facility]

## 2022-06-13 ENCOUNTER — Other Ambulatory Visit: Payer: Self-pay | Admitting: Medical

## 2022-06-13 DIAGNOSIS — I10 Essential (primary) hypertension: Secondary | ICD-10-CM

## 2022-06-22 ENCOUNTER — Other Ambulatory Visit: Payer: Self-pay | Admitting: Internal Medicine

## 2022-06-22 DIAGNOSIS — I10 Essential (primary) hypertension: Secondary | ICD-10-CM

## 2022-06-25 DIAGNOSIS — G4733 Obstructive sleep apnea (adult) (pediatric): Secondary | ICD-10-CM | POA: Diagnosis not present

## 2022-07-05 ENCOUNTER — Other Ambulatory Visit: Payer: Self-pay | Admitting: Medical

## 2022-07-05 DIAGNOSIS — I10 Essential (primary) hypertension: Secondary | ICD-10-CM

## 2022-07-24 DIAGNOSIS — G4733 Obstructive sleep apnea (adult) (pediatric): Secondary | ICD-10-CM | POA: Diagnosis not present

## 2022-08-06 ENCOUNTER — Other Ambulatory Visit: Payer: Self-pay | Admitting: Medical

## 2022-08-20 ENCOUNTER — Ambulatory Visit: Payer: 59 | Admitting: Medical

## 2022-08-21 ENCOUNTER — Other Ambulatory Visit: Payer: Self-pay | Admitting: Family Medicine

## 2022-08-21 ENCOUNTER — Other Ambulatory Visit: Payer: Self-pay | Admitting: Medical

## 2022-08-21 DIAGNOSIS — F411 Generalized anxiety disorder: Secondary | ICD-10-CM

## 2022-08-21 NOTE — Telephone Encounter (Signed)
Requested Prescriptions  Pending Prescriptions Disp Refills   sertraline (ZOLOFT) 25 MG tablet [Pharmacy Med Name: SERTRALINE HCL 25 MG TABLET] 270 tablet 0    Sig: TAKE 3 TABLETS BY MOUTH DAILY     Psychiatry:  Antidepressants - SSRI - sertraline Passed - 08/21/2022  1:45 AM      Passed - AST in normal range and within 360 days    AST  Date Value Ref Range Status  12/18/2021 24 0 - 40 IU/L Final         Passed - ALT in normal range and within 360 days    ALT  Date Value Ref Range Status  12/18/2021 21 0 - 44 IU/L Final         Passed - Completed PHQ-2 or PHQ-9 in the last 360 days      Passed - Valid encounter within last 6 months    Recent Outpatient Visits           3 months ago Type 2 diabetes mellitus with stage 4 chronic kidney disease, with long-term current use of insulin (HCC)   [place] [facility], Ocie Bob, MD   6 months ago GAD (generalized anxiety disorder)   [place] [facility], Ocie Bob, MD   8 months ago GAD (generalized anxiety disorder)   [place] [facility], Ocie Bob, MD   1 year ago Hypertension, unspecified type   [facility] at [facility], Ocie Bob, MD   1 year ago Type 2 diabetes mellitus with hyperosmolar hyperglycemic state (HHS) [facility])   [place] [facility] at [facility], Ocie Bob, MD       Future Appointments             In 2 weeks [name], Cadence H, PA-C  [facility] [place]   In 4 months Ashley Royalty, Ocie Bob, MD [facility] & [facility] Medicine at [facility], [facility]

## 2022-08-24 DIAGNOSIS — G4733 Obstructive sleep apnea (adult) (pediatric): Secondary | ICD-10-CM | POA: Diagnosis not present

## 2022-09-02 ENCOUNTER — Other Ambulatory Visit: Payer: Self-pay | Admitting: Family Medicine

## 2022-09-04 NOTE — Telephone Encounter (Signed)
Requested Prescriptions  Pending Prescriptions Disp Refills   B-D UF III MINI PEN NEEDLES 31G X 5 MM MISC [Pharmacy Med Name: BD UF MINI PEN NEEDLE 5MMX31G] 100 each 1    Sig: USE AS DIRECTED AT BEDTIME     Endocrinology: Diabetes - Testing Supplies Passed - 09/02/2022  2:00 PM      Passed - Valid encounter within last 12 months    Recent Outpatient Visits           3 months ago Type 2 diabetes mellitus with stage 4 chronic kidney disease, with long-term current use of insulin (HCC)   [place] [facility], Ocie Bob, MD   7 months ago GAD (generalized anxiety disorder)   [place] [facility], Ocie Bob, MD   9 months ago GAD (generalized anxiety disorder)   [place] [facility], Ocie Bob, MD   1 year ago Hypertension, unspecified type   [facility] at [facility], Ocie Bob, MD   1 year ago Type 2 diabetes mellitus with hyperosmolar hyperglycemic state (HHS) [facility])   [place] [facility] at [facility], Ocie Bob, MD       Future Appointments             In 3 days [name], Cadence H, PA-C [place] [facility] [place]   In 3 months Ashley Royalty, Ocie Bob, MD [facility] Medicine at [facility], [facility]

## 2022-09-07 ENCOUNTER — Ambulatory Visit: Payer: 59 | Admitting: Medical

## 2022-09-17 ENCOUNTER — Other Ambulatory Visit: Payer: Self-pay | Admitting: Medical

## 2022-09-25 DIAGNOSIS — G4733 Obstructive sleep apnea (adult) (pediatric): Secondary | ICD-10-CM | POA: Diagnosis not present

## 2022-10-10 ENCOUNTER — Other Ambulatory Visit: Payer: Self-pay | Admitting: Medical

## 2022-10-10 DIAGNOSIS — I10 Essential (primary) hypertension: Secondary | ICD-10-CM

## 2022-10-11 ENCOUNTER — Encounter: Payer: Self-pay | Admitting: Medical

## 2022-10-11 ENCOUNTER — Ambulatory Visit: Payer: 59 | Attending: Medical | Admitting: Medical

## 2022-10-11 VITALS — BP 110/60 | HR 83 | Ht 64.0 in | Wt 228.4 lb

## 2022-10-11 DIAGNOSIS — I5032 Chronic diastolic (congestive) heart failure: Secondary | ICD-10-CM | POA: Diagnosis not present

## 2022-10-11 DIAGNOSIS — I10 Essential (primary) hypertension: Secondary | ICD-10-CM

## 2022-10-11 DIAGNOSIS — N183 Chronic kidney disease, stage 3 unspecified: Secondary | ICD-10-CM

## 2022-10-11 DIAGNOSIS — G4733 Obstructive sleep apnea (adult) (pediatric): Secondary | ICD-10-CM

## 2022-10-11 NOTE — Progress Notes (Signed)
Cardiology Office Note:    Date:  10/11/2022   ID:  David Klein, DOB April 28, 1971, MRN [id number]  PCP:  Jerrol Banana, MD  [facility] Cardiologist:  Yvonne Kendall, MD  [facility] Electrophysiologist:  None   Referring MD: Jerrol Banana, MD   Chief Complaint: 1 month follow-up  History of Present Illness:    David Klein is a 52 y.o. male with a hx of HFpEF, HTN, DM2, OSA, CKD stage 3 who presents for 1 month follow-up.    Hospitalization in 2022 with hypertensive urgency and hyperosmolar hyperglycemic state complicated by AKI on CKD. Referred to cardiology for BP control.    Patient was seen 04/12/21 and reported intermittent SOB and LLE on torsemide. BP was elevated and heart rate was high. Coreg was added. Echo and renal US were ordered. Echo showed normal LVEF, mild LVH and G2DD. US renal arteries showed no stenosis. LLE improved on Torsemide.   The patient was last seen 04/2022 and reported BP elevated at home. Imdur was increased to 60mg  daily.   Today, BP is much better. BP at home has been good, highest SBP 140 He denies chest pain or SOB. HE has mild LLE, he takes torsemide daily. He denies orthopnea or pnd. Patient tries to eat low salt. He trying to do more activity to lose weight.    Past Medical History:  Diagnosis Date   Allergy    Diabetes mellitus without complication (HCC)    Hyperlipidemia    Hypertension    Sleep apnea     Past Surgical History:  Procedure Laterality Date   COLONOSCOPY WITH PROPOFOL N/A 03/07/2022   Procedure: COLONOSCOPY WITH PROPOFOL;  Surgeon: Toney Reil, MD;  [facility] END[facility];  Service: Gastroenterology;  Laterality: N/A;   TOOTH EXTRACTION     WISDOM TOOTH EXTRACTION Bilateral     Current Medications: Current Meds  Medication Sig   aspirin 81 MG EC tablet Take 1 tablet (81 mg total) by mouth daily.   B-D UF III MINI PEN NEEDLES 31G X 5 MM MISC USE AS DIRECTED AT BEDTIME   blood glucose  meter kit and supplies KIT Dispense based on patient and insurance preference. Use up to four times daily as directed. (Patient taking differently: Inject 1 each into the skin See admin instructions. Dispense based on patient and insurance preference. Use up to four times daily as directed.)   carvedilol (COREG) 25 MG tablet TAKE 1 TABLET BY MOUTH TWICE A DAY   Continuous Blood Gluc Sensor (FREESTYLE LIBRE 2 SENSOR) MISC 1 Device by Does not apply route every 14 (fourteen) days.   hydrALAZINE (APRESOLINE) 100 MG tablet TAKE 1 TABLET BY MOUTH 3 TIMES DAILY.   insulin glargine (LANTUS SOLOSTAR) 100 UNIT/ML Solostar Pen Inject 40 Units into the skin every morning. And pen needles 1/day.   isosorbide mononitrate (IMDUR) 60 MG 24 hr tablet TAKE 1 TABLET BY MOUTH EVERY DAY   losartan (COZAAR) 25 MG tablet Take 25 mg by mouth daily.   rosuvastatin (CRESTOR) 40 MG tablet TAKE 1 TABLET BY MOUTH EVERY DAY   Semaglutide, 1 MG/DOSE, (OZEMPIC, 1 MG/DOSE,) 2 MG/1.5ML SOPN Inject 1 mg into the skin once a week.   sertraline (ZOLOFT) 25 MG tablet TAKE 3 TABLETS BY MOUTH DAILY   torsemide (DEMADEX) 20 MG tablet TAKE 1 TABLET BY MOUTH TWICE A DAY     Allergies:   Patient has no known allergies.   Social History   Socioeconomic  History   Marital status: Single    Spouse name: Not on file   Number of children: 0   Years of education: 53   Highest education level: Master's degree (e.g., MA, MS, MEng, MEd, MSW, MBA)  Occupational History   Not on file  Tobacco Use   Smoking status: Former    Types: Cigars   Smokeless tobacco: Never   Tobacco comments:    No cigars in over 1 year  Vaping Use   Vaping Use: Never used  Substance and Sexual Activity   Alcohol use: Yes    Comment: Few drinks every few months.   Drug use: Never   Sexual activity: Not Currently    Partners: Female  Other Topics Concern   Not on file  Social History Narrative   Not on file   Social Determinants of Health   Financial  Resource Strain: Not on file  Food Insecurity: No Food Insecurity (02/05/2022)   Hunger Vital Sign    Worried About Running Out of Food in the Last Year: Never true    Ran Out of Food in the Last Year: Never true  Transportation Needs: No Transportation Needs (02/05/2022)   PRAPARE - Administrator, Civil Service (Medical): No    Lack of Transportation (Non-Medical): No  Physical Activity: Not on file  Stress: Not on file  Social Connections: Not on file     Family History: The patient's family history includes Diabetes in his sister and sister; Hypertension in his brother, father, mother, sister, and sister; Lung cancer in his father; Stroke in his paternal grandmother.  ROS:   Please see the history of present illness.     All other systems reviewed and are negative.  EKGs/Labs/Other Studies Reviewed:    The following studies were reviewed today:   Echo 04/2021  1. Left ventricular ejection fraction, by estimation, is 55 to 60%. The  left ventricle has normal function. The left ventricle has no regional  wall motion abnormalities. There is mild to moderate left ventricular  hypertrophy. Left ventricular diastolic  parameters are consistent with Grade II diastolic dysfunction  (pseudonormalization).   2. Right ventricular systolic function is normal. The right ventricular  size is normal. Tricuspid regurgitation signal is inadequate for assessing  PA pressure.   3. Left atrial size was mildly dilated.   4. The mitral valve is normal in structure. Mild mitral valve  regurgitation. No evidence of mitral stenosis.   5. The aortic valve was not well visualized. Aortic valve regurgitation  is not visualized. No aortic stenosis is present.   6. The inferior vena cava is normal in size with greater than 50%  respiratory variability, suggesting right atrial pressure of 3 mmHg.    Renal US 04/2021 Right: Normal size right kidney. Normal right Resisitive Index.          Abnormal cortical thickness of right kidney. No evidence of         right renal artery stenosis. RRV flow present.  Left:  LRV flow present. No evidence of left renal artery stenosis.         Normal size of left kidney. Normal left Resistive Index.         Abnormal cortical thickness of the left kidney.  Mesenteric:  Normal Celiac artery and Superior Mesenteric artery findings.  Ectatic aorta.     EKG:  EKG is not ordered today.    Recent Labs: 12/18/2021: ALT 21; BUN 63; Creatinine, Ser  4.23; Potassium 4.2; Sodium 135  Recent Lipid Panel    Component Value Date/Time   CHOL 75 (L) 12/18/2021 0938   TRIG 146 12/18/2021 0938   HDL 36 (L) 12/18/2021 0938   CHOLHDL 2.1 12/18/2021 0938   CHOLHDL 6.0 02/05/2021 0552   VLDL UNABLE TO CALCULATE IF TRIGLYCERIDE OVER 400 mg/dL 16/01/9603 5409   LDLCALC 14 12/18/2021 0938   LDLDIRECT 152.5 (H) 02/05/2021 0552     Physical Exam:    VS:  BP 110/60 (BP Location: Left Arm, Patient Position: Sitting, Cuff Size: Large)   Pulse 83   Ht 5\' 4"  (1.626 m)   Wt 228 lb 6 oz (103.6 kg)   SpO2 96%   BMI 39.20 kg/m     Wt Readings from Last 3 Encounters:  10/11/22 228 lb 6 oz (103.6 kg)  05/25/22 230 lb 11.2 oz (104.6 kg)  05/21/22 229 lb 9.6 oz (104.1 kg)     GEN:  Well nourished, well developed in no acute distress HEENT: Normal NECK: No JVD; No carotid bruits LYMPHATICS: No lymphadenopathy CARDIAC: RRR, +murmur, no rubs, gallops RESPIRATORY:  Clear to auscultation without rales, wheezing or rhonchi  ABDOMEN: Soft, non-tender, non-distended MUSCULOSKELETAL:  No edema; No deformity  SKIN: Warm and dry NEUROLOGIC:  Alert and oriented x 3 PSYCHIATRIC:  Normal affect   ASSESSMENT:    1. Chronic diastolic heart failure (HCC)   2. Essential hypertension   3. Stage 3 chronic kidney disease, unspecified whether stage 3a or 3b CKD (HCC)   4. Morbid obesity (HCC)   5. OSA (obstructive sleep apnea)    PLAN:    In order of problems listed  above:  HFpEF Echo in 04/2021 showed LVEF 55-60%, no WMA, G2DD, mild MR. Patient has mild lower leg edema on exam. He takes torsemide 20mg  BID. Weight is stable. He can take an extra Torsemide as needed for volume management.   HTN BP is much better today. He reports normal numbers at home, highest systolic 140. Continue Coreg 25mg  BID, Imdur 60mg , Hydralazine 100mg  TID and Losartan 25mg  daily.   CKD stage 3-4 He follows with nephrology, Scr baseline around 4.  Obesity He is working on diet and weight loss.   OSA on CPAP He is compliant with CPAP.   Disposition: Follow up in 6 month(s) with MD/APP    Signed, Maryem Shuffler David Stall, PA-C  10/11/2022 3:56 PM    [facility]

## 2022-10-11 NOTE — Patient Instructions (Signed)
Medication Instructions:  Your Physician recommend you continue on your current medication as directed.    *If you need a refill on your cardiac medications before your next appointment, please call your pharmacy*   Lab Work: None ordered today   Testing/Procedures: None ordered today   Follow-Up: At [facility], you and your health needs are our priority.  As part of our continuing mission to provide you with exceptional heart care, we have created designated Provider Care Teams.  These Care Teams include your primary Cardiologist (physician) and Advanced Practice Providers (APPs -  Physician Assistants and Nurse Practitioners) who all work together to provide you with the care you need, when you need it.  We recommend signing up for the patient portal called "MyChart".  Sign up information is provided on this After Visit Summary.  MyChart is used to connect with patients for Virtual Visits (Telemedicine).  Patients are able to view lab/test results, encounter notes, upcoming appointments, etc.  Non-urgent messages can be sent to your provider as well.   To learn more about what you can do with MyChart, go to [email or web address].    Your next appointment:   6 month(s)  Provider:   You may see Christopher End, MD or one of the following Advanced Practice Providers on your designated Care Team:   Christopher Berge, NP Ryan Dunn, PA-C Cadence [name], PA-C Sheri Hammock, NP     

## 2022-10-12 DIAGNOSIS — I10 Essential (primary) hypertension: Secondary | ICD-10-CM | POA: Diagnosis not present

## 2022-10-12 DIAGNOSIS — E1122 Type 2 diabetes mellitus with diabetic chronic kidney disease: Secondary | ICD-10-CM | POA: Diagnosis not present

## 2022-10-12 DIAGNOSIS — Z794 Long term (current) use of insulin: Secondary | ICD-10-CM | POA: Diagnosis not present

## 2022-10-12 DIAGNOSIS — N179 Acute kidney failure, unspecified: Secondary | ICD-10-CM | POA: Diagnosis not present

## 2022-10-12 DIAGNOSIS — N1831 Chronic kidney disease, stage 3a: Secondary | ICD-10-CM | POA: Diagnosis not present

## 2022-10-12 DIAGNOSIS — R801 Persistent proteinuria, unspecified: Secondary | ICD-10-CM | POA: Diagnosis not present

## 2022-10-26 ENCOUNTER — Other Ambulatory Visit: Payer: Self-pay | Admitting: Family Medicine

## 2022-10-26 DIAGNOSIS — F411 Generalized anxiety disorder: Secondary | ICD-10-CM

## 2022-10-29 NOTE — Telephone Encounter (Signed)
Dc 'd 05/09/22 Dr Ashley Royalty "reorder"  Requested Prescriptions  Refused Prescriptions Disp Refills   sertraline (ZOLOFT) 50 MG tablet [Pharmacy Med Name: SERTRALINE HCL 50 MG TABLET] 90 tablet 1    Sig: TAKE 1 TABLET BY MOUTH EVERY DAY     Psychiatry:  Antidepressants - SSRI - sertraline Passed - 10/26/2022  2:33 AM      Passed - AST in normal range and within 360 days    AST  Date Value Ref Range Status  12/18/2021 24 0 - 40 IU/L Final         Passed - ALT in normal range and within 360 days    ALT  Date Value Ref Range Status  12/18/2021 21 0 - 44 IU/L Final         Passed - Completed PHQ-2 or PHQ-9 in the last 360 days      Passed - Valid encounter within last 6 months    Recent Outpatient Visits           5 months ago Type 2 diabetes mellitus with stage 4 chronic kidney disease, with long-term current use of insulin (HCC)   [place] [facility], Ocie Bob, MD   8 months ago GAD (generalized anxiety disorder)   [place] [facility], Ocie Bob, MD   10 months ago GAD (generalized anxiety disorder)   [place] [facility], Ocie Bob, MD   1 year ago Hypertension, unspecified type   [facility], Ocie Bob, MD   1 year ago Type 2 diabetes mellitus with hyperosmolar hyperglycemic state (HHS) [facility])    Primary Care & Sports Medicine at [facility], Ocie Bob, MD       Future Appointments             In 1 month Ashley Royalty, Ocie Bob, MD [facility] Primary Care & [facility] Medicine at [facility], [facility]   In 5 months End, Cristal Deer, MD [facility] at [facility]

## 2022-11-15 ENCOUNTER — Other Ambulatory Visit: Payer: Self-pay | Admitting: Family Medicine

## 2022-11-15 ENCOUNTER — Other Ambulatory Visit: Payer: Self-pay | Admitting: Medical

## 2022-11-15 DIAGNOSIS — F411 Generalized anxiety disorder: Secondary | ICD-10-CM

## 2022-11-16 NOTE — Telephone Encounter (Signed)
Requested Prescriptions  Pending Prescriptions Disp Refills   sertraline (ZOLOFT) 25 MG tablet [Pharmacy Med Name: SERTRALINE HCL 25 MG TABLET] 270 tablet 0    Sig: TAKE 3 TABLETS BY MOUTH DAILY     Psychiatry:  Antidepressants - SSRI - sertraline Failed - 11/15/2022  2:41 AM      Failed - Valid encounter within last 6 months    Recent Outpatient Visits           6 months ago Type 2 diabetes mellitus with stage 4 chronic kidney disease, with long-term current use of insulin (HCC)   [place] [facility] Ashley Royalty, Ocie Bob, MD   9 months ago GAD (generalized anxiety disorder)   [place] [facility], Ocie Bob, MD   11 months ago GAD (generalized anxiety disorder)   [place] [facility], Ocie Bob, MD   1 year ago Hypertension, unspecified type   [facility], Ocie Bob, MD   1 year ago Type 2 diabetes mellitus with hyperosmolar hyperglycemic state (HHS) [facility])   [place] [facility], Ocie Bob, MD       Future Appointments             In 1 month Ashley Royalty Ocie Bob, MD [facility] Primary Care & Sports Medicine at [facility], [facility]   In 5 months End, Cristal Deer, MD [facility] at [facility] - AST in normal range and within 360 days    AST  Date Value Ref Range Status  12/18/2021 24 0 - 40 IU/L Final         Passed - ALT in normal range and within 360 days    ALT  Date Value Ref Range Status  12/18/2021 21 0 - 44 IU/L Final         Passed - Completed PHQ-2 or PHQ-9 in the last 360 days

## 2022-12-07 ENCOUNTER — Other Ambulatory Visit: Payer: Self-pay | Admitting: Medical

## 2022-12-07 DIAGNOSIS — I10 Essential (primary) hypertension: Secondary | ICD-10-CM

## 2022-12-19 ENCOUNTER — Encounter: Payer: 59 | Admitting: Family Medicine

## 2022-12-22 IMAGING — MR MR HEAD W/O CM
11 series · 41 of 48 positions shown · non-contrast
Comparison: Head CT 02/04/2021.

CLINICAL DATA: Neuro deficit, acute, stroke suspected. Additional
history provided: Patient with history of hypertension, diabetes
mellitus, CKD, presenting with headache.

EXAM:
MRI HEAD WITHOUT CONTRAST
TECHNIQUE: Multiplanar, multiecho pulse sequences of the brain and surrounding
structures were obtained without intravenous contrast.

[Series 5: ax dwi_tracew · axial · 3.0mm · 0.65mm/px · z∈[-134,+21]mm · 5 of 48 slices shown]
[im 1/48]
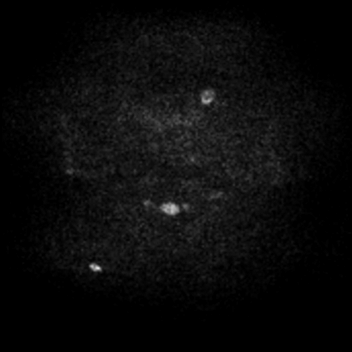
[im 12/48]
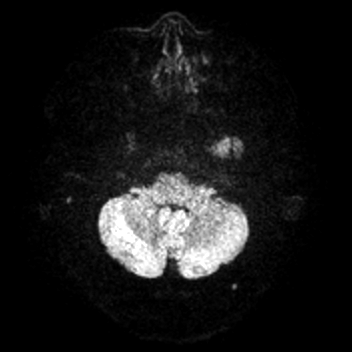
[im 24/48]
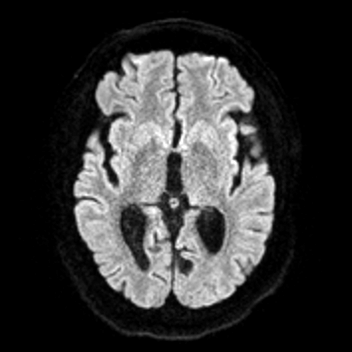
[im 36/48]
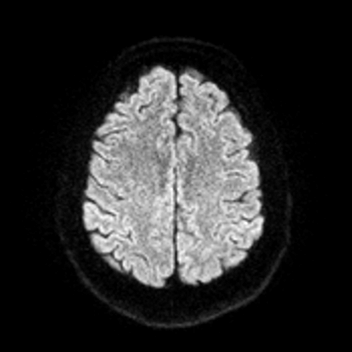
[im 48/48]
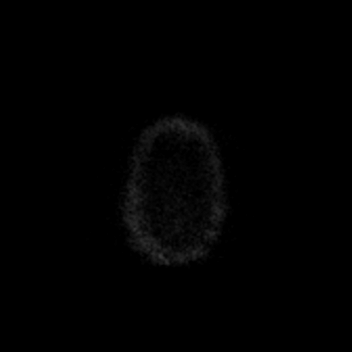

[Series 6: ax dwi_adc · axial · 3.0mm · 0.65mm/px · z∈[-134,+14]mm · 5 of 45 slices shown]
[im 1/45]
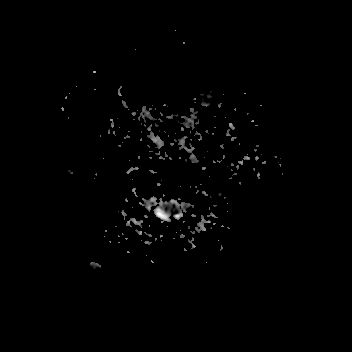
[im 12/45]
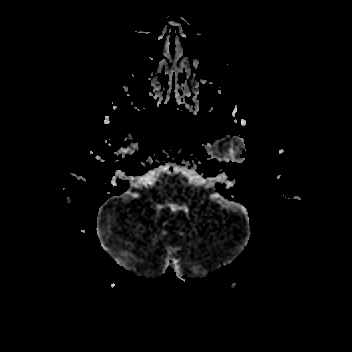
[im 23/45]
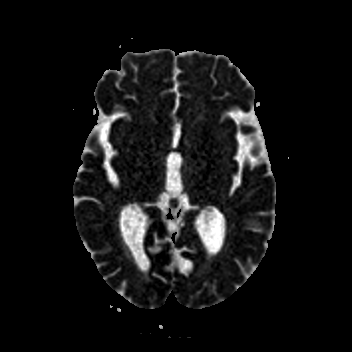
[im 34/45]
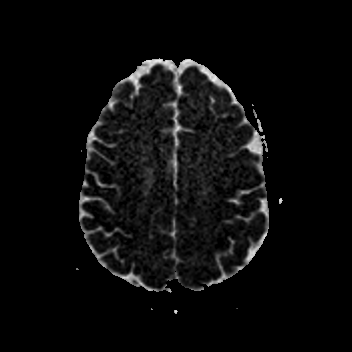
[im 45/45]
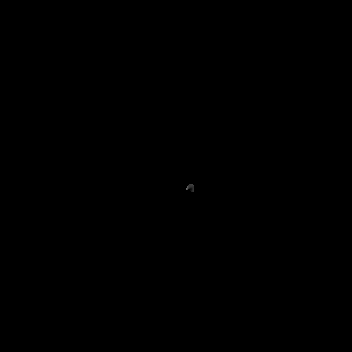

[Series 7: cor dwi_tracew · coronal · 5.0mm · 0.68mm/px · 3 of 40 slices shown]
[im 1/40]
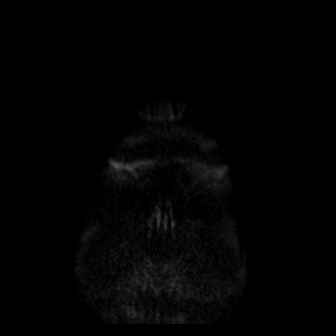
[im 20/40]
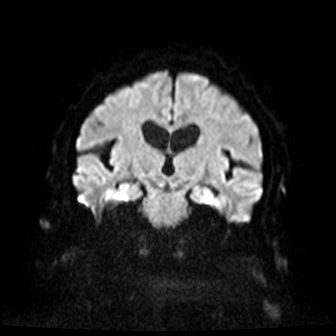
[im 40/40]
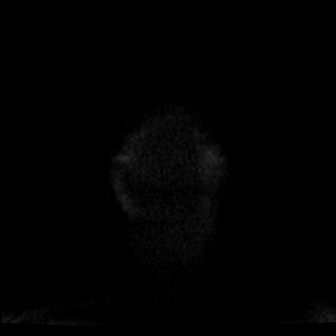

[Series 8: cor dwi_adc · coronal · 5.0mm · 0.68mm/px · 3 of 40 slices shown]
[im 1/40]
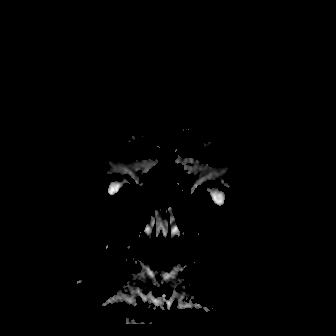
[im 20/40]
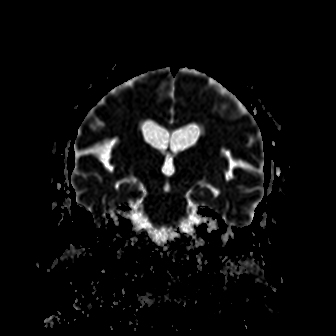
[im 40/40]
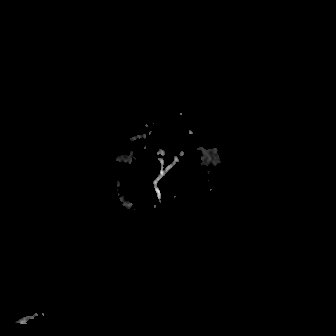

[Series 9: T1 · sagittal · 5.0mm · 0.62mm/px · 2 of 23 slices shown (1 of 2)]
[im 1/23]
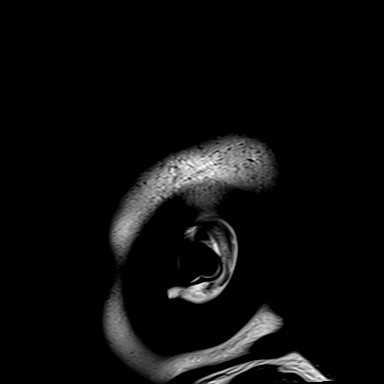
[im 23/23]
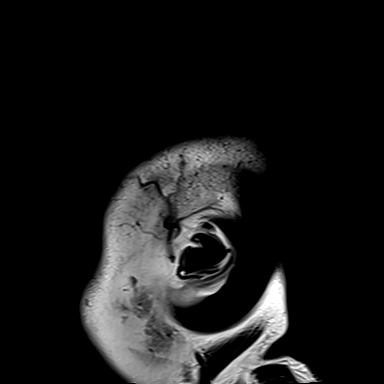

[Series 10: T2 · axial · 5.0mm · 0.45mm/px · z∈[-135,+20]mm · 2 of 27 slices shown (1 of 2)]
[im 1/27]
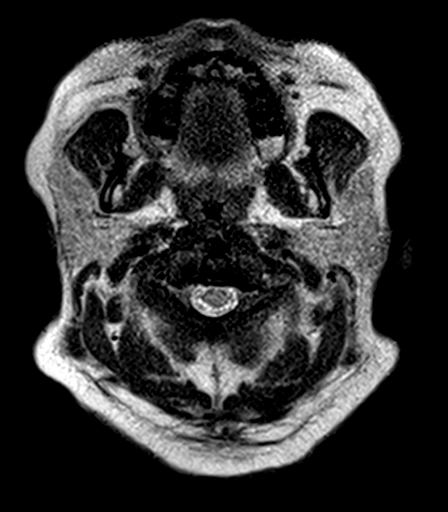
[im 27/27]
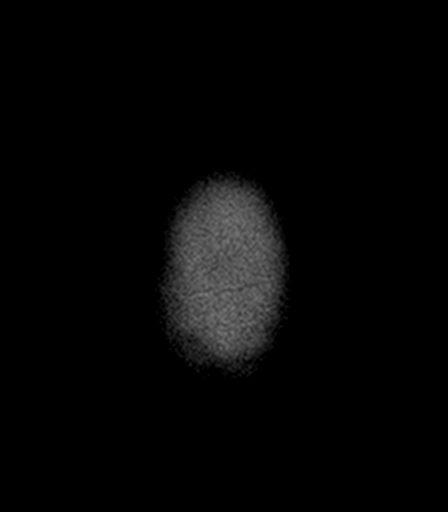

[Series 12: pha_images · axial · 3.0mm · 0.90mm/px · z∈[-134,+19]mm · 4 of 52 slices shown]
[im 1/52]
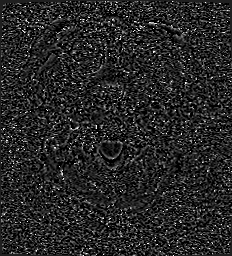
[im 18/52]
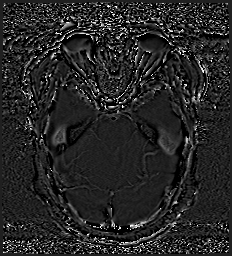
[im 35/52]
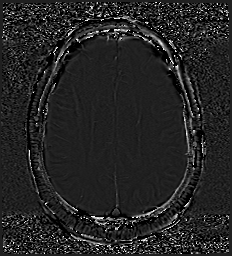
[im 52/52]
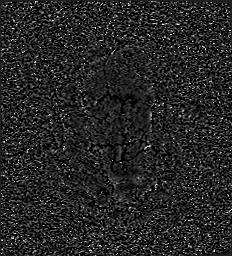

[Series 13: swi_images · axial · 3.0mm · 0.90mm/px · z∈[-134,-83]mm · 2 of 52 slices shown]
[im 1/52]
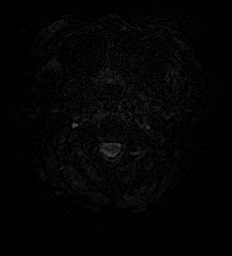
[im 18/52]
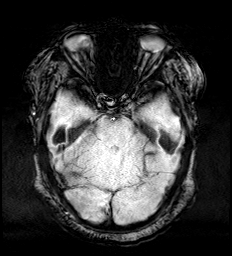

[Series 15: FLAIR · axial · 3.0mm · 0.53mm/px · z∈[-135,+21]mm · 4 of 53 slices shown]
[im 1/53]
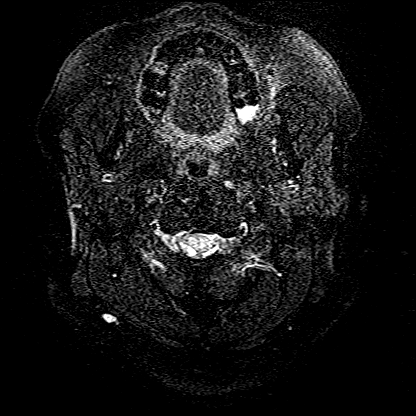
[im 18/53]
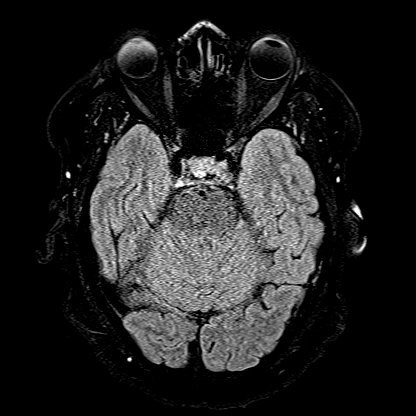
[im 35/53]
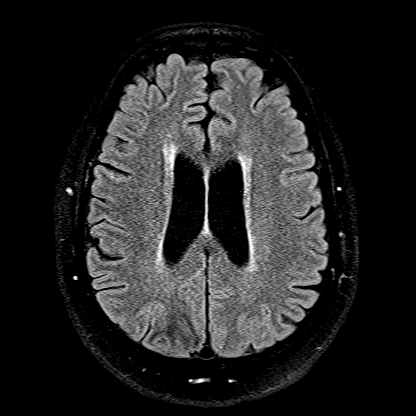
[im 53/53]
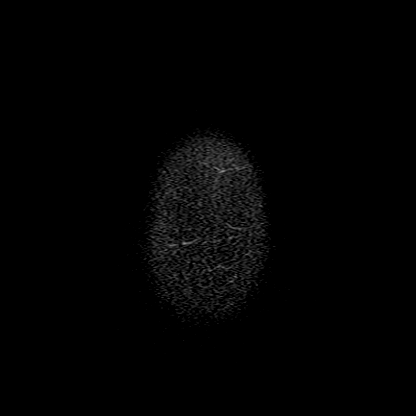

[Series 16: T1 · axial · 1.0mm · 0.98mm/px · z∈[-136,+22]mm · 8 of 160 slices shown (2 of 2)]
[im 1/160]
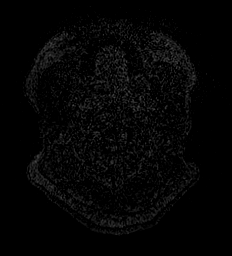
[im 27/160]
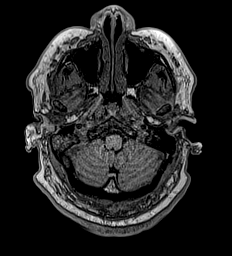
[im 54/160]
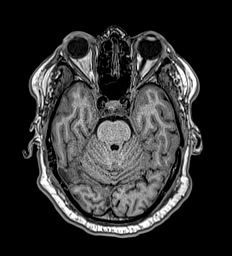
[im 67/160]
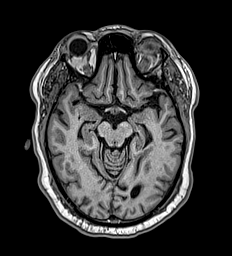
[im 93/160]
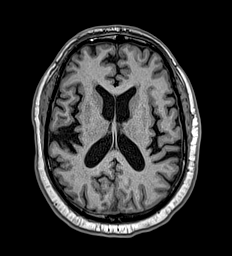
[im 107/160]
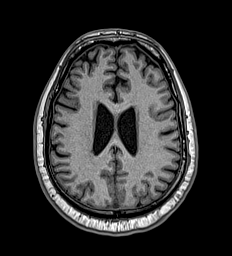
[im 133/160]
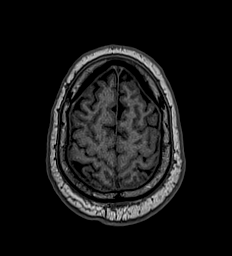
[im 160/160]
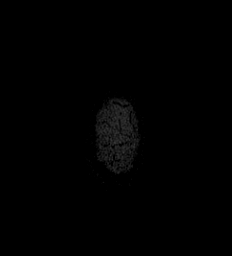

[Series 17: T2 · coronal · 5.0mm · 0.45mm/px · 3 of 32 slices shown (2 of 2)]
[im 1/32]
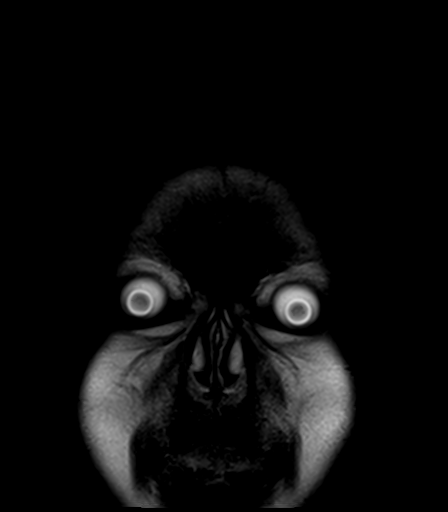
[im 16/32]
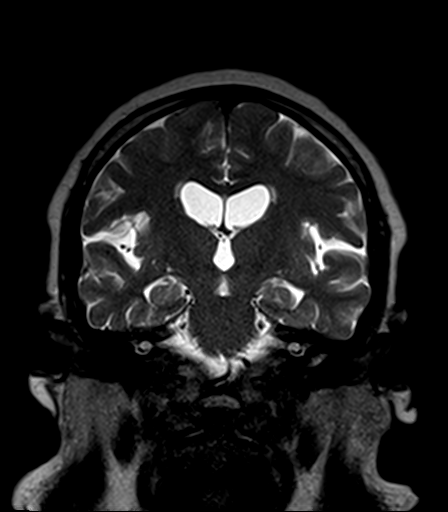
[im 32/32]
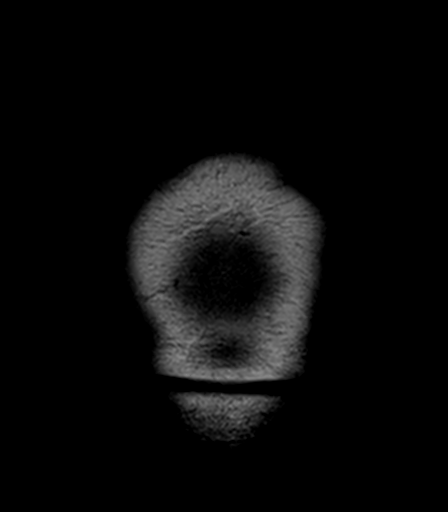

[41 of 48 positions shown; findings below may reference images not displayed]

FINDINGS: Brain:

Cerebral volume is normal for age.

No cortical encephalomalacia is identified.

Mild multifocal T2 FLAIR hyperintense signal abnormality within the
cerebral white matter, nonspecific but compatible with chronic small
vessel ischemic disease.

There is no acute infarct.

No evidence of an intracranial mass.

No chronic intracranial blood products.

No extra-axial fluid collection.

No midline shift.

Vascular: Maintained flow voids within the proximal large arterial
vessels.

Skull and upper cervical spine: No focal suspicious marrow lesion.

Sinuses/Orbits: Visualized orbits show no acute finding. Mild
mucosal thickening within the right maxillary sinus.
IMPRESSION: No evidence of acute intracranial abnormality.

Mild chronic small vessel ischemic changes within the cerebral white
matter.

Mild mucosal thickening within the right maxillary sinus.

## 2022-12-22 IMAGING — DX DG CHEST 1V PORT
1 series · 1 of 1 positions shown · non-contrast
Comparison: None.

CLINICAL DATA: Chest pain

EXAM:
PORTABLE CHEST 1 VIEW

[chest ap]
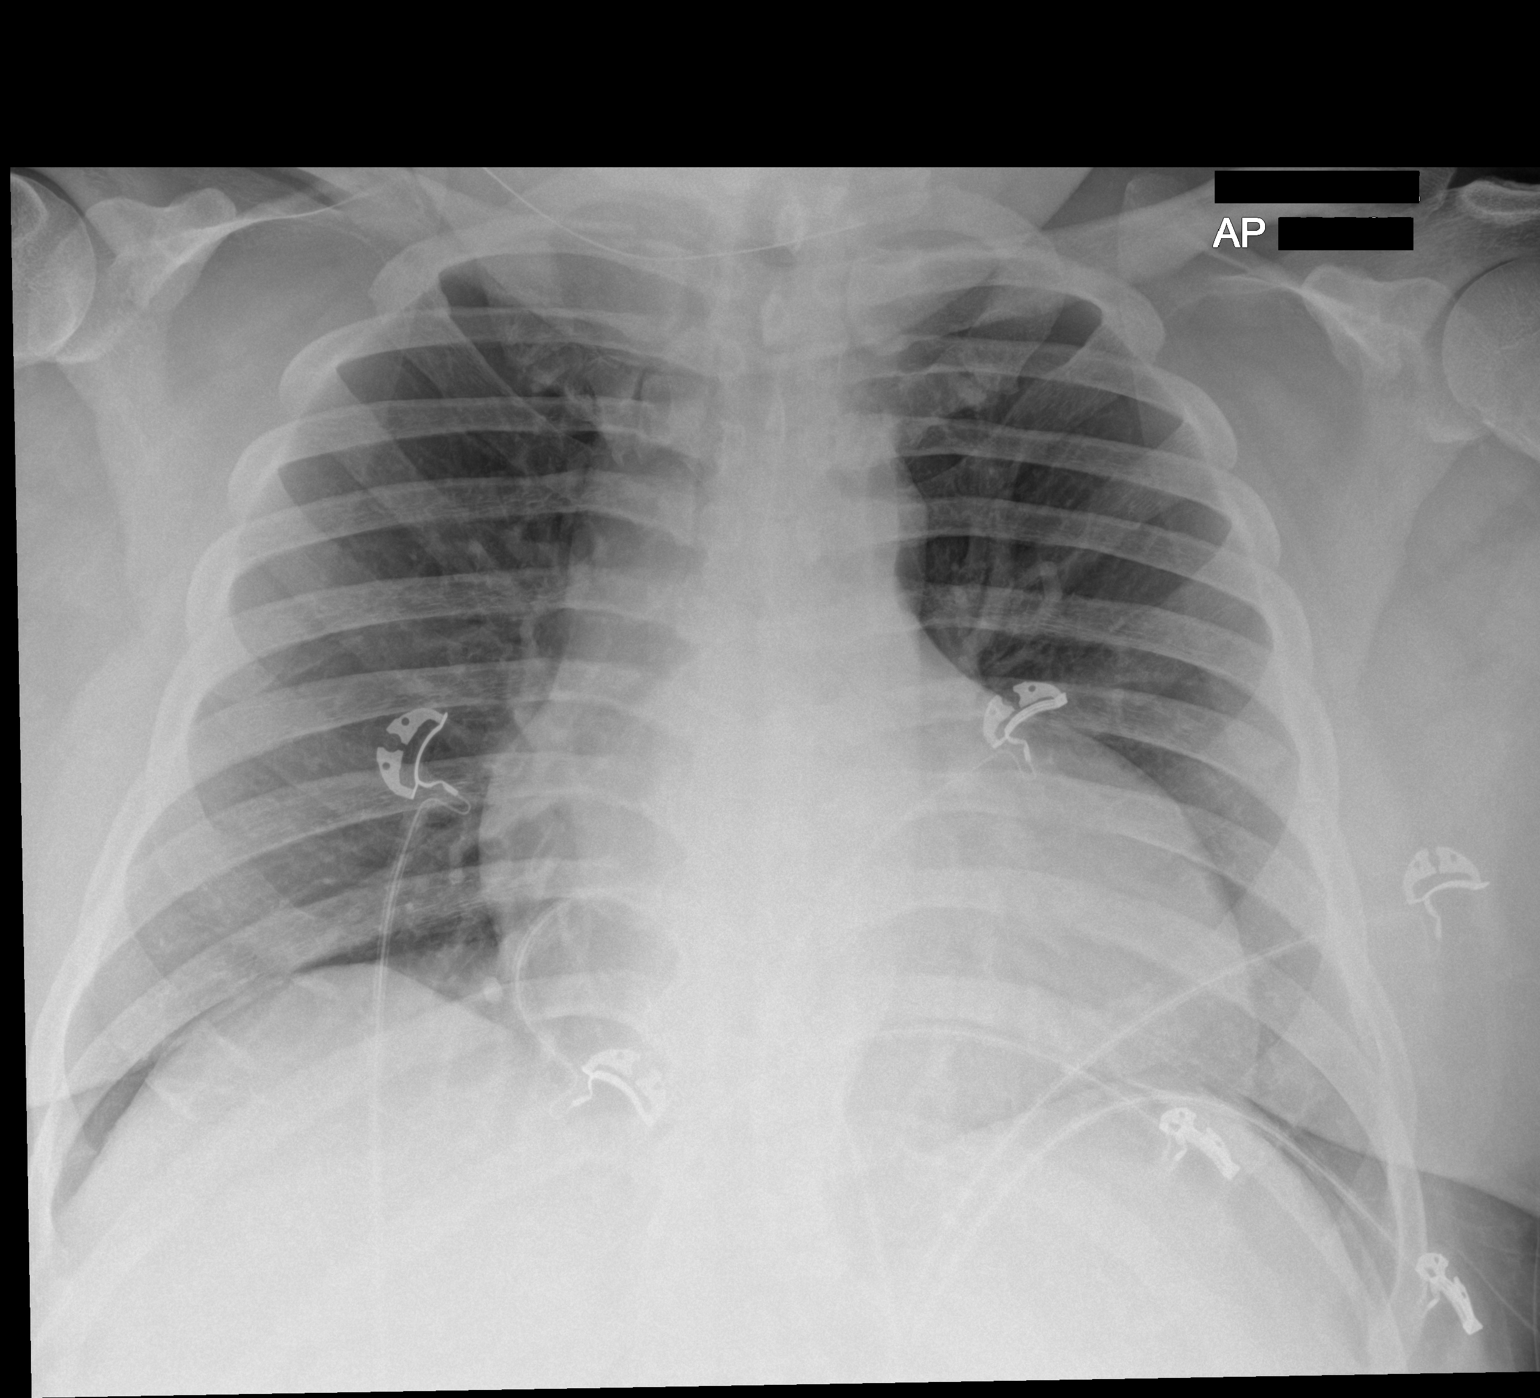

[1 of 1 positions shown; findings below may reference images not displayed]

FINDINGS: The mediastinal contour is normal. The heart size is mildly
enlarged. Both lungs are clear. The visualized skeletal structures
are unremarkable.
IMPRESSION: No active disease.

## 2022-12-26 DIAGNOSIS — G4733 Obstructive sleep apnea (adult) (pediatric): Secondary | ICD-10-CM | POA: Diagnosis not present

## 2023-01-10 ENCOUNTER — Other Ambulatory Visit: Payer: Self-pay | Admitting: Medical

## 2023-01-10 DIAGNOSIS — I10 Essential (primary) hypertension: Secondary | ICD-10-CM

## 2023-01-26 DIAGNOSIS — G4733 Obstructive sleep apnea (adult) (pediatric): Secondary | ICD-10-CM | POA: Diagnosis not present

## 2023-02-25 DIAGNOSIS — G4733 Obstructive sleep apnea (adult) (pediatric): Secondary | ICD-10-CM | POA: Diagnosis not present

## 2023-04-17 ENCOUNTER — Other Ambulatory Visit: Payer: Self-pay | Admitting: Medical

## 2023-04-17 DIAGNOSIS — I10 Essential (primary) hypertension: Secondary | ICD-10-CM

## 2023-04-17 NOTE — Progress Notes (Deleted)
  Cardiology Office Note:  .   Date:  04/17/2023  ID:  [name], DOB [birth year], MRN [id number] PCP: Jerrol Banana, MD  [facility] Providers Cardiologist:  Yvonne Kendall, MD { Click to update primary MD,subspecialty MD or APP then REFRESH:1}    History of Present Illness: .   David Klein is a 52 y.o. male with history of chronic HFpEF, hyperlipidemia, type 2 diabetes mellitus, obstructive sleep apnea, hypertension, and chronic kidney disease, who presents for follow-up of hypertension and HFpEF.  He was last seen in our office in June, at which time he was feeling well and reported good blood pressure control at home.  ROS: See HPI  Studies Reviewed: Marland Kitchen        TTE (05/18/2021):  1. Left ventricular ejection fraction, by estimation, is 55 to 60%. The  left ventricle has normal function. The left ventricle has no regional  wall motion abnormalities. There is mild to moderate left ventricular  hypertrophy. Left ventricular diastolic  parameters are consistent with Grade II diastolic dysfunction  (pseudonormalization).   2. Right ventricular systolic function is normal. The right ventricular  size is normal. Tricuspid regurgitation signal is inadequate for assessing  PA pressure.   3. Left atrial size was mildly dilated.   4. The mitral valve is normal in structure. Mild mitral valve  regurgitation. No evidence of mitral stenosis.   5. The aortic valve was not well visualized. Aortic valve regurgitation  is not visualized. No aortic stenosis is present.   6. The inferior vena cava is normal in size with greater than 50%  respiratory variability, suggesting right atrial pressure of 3 mmHg.   Renal artery Doppler (05/18/2021): Normal renal artery flow bilaterally without evidence of stenosis.  Risk Assessment/Calculations:   {Does this patient have ATRIAL FIBRILLATION?:[id number]} No BP recorded.  {Refresh Note OR Click here to enter BP  :1}***        Physical Exam:   VS:  There were no vitals taken for this visit.   Wt Readings from Last 3 Encounters:  10/11/22 228 lb 6 oz (103.6 kg)  05/25/22 230 lb 11.2 oz (104.6 kg)  05/21/22 229 lb 9.6 oz (104.1 kg)    General:  NAD. Neck: No JVD or HJR. Lungs: Clear to auscultation bilaterally without wheezes or crackles. Heart: Regular rate and rhythm without murmurs, rubs, or gallops. Abdomen: Soft, nontender, nondistended. Extremities: No lower extremity edema.  ASSESSMENT AND PLAN: .    ***    {Are you ordering a CV Procedure (e.g. stress test, cath, DCCV, TEE, etc)?   Press F2        :[id number]}  Dispo: ***  Signed, Yvonne Kendall, MD

## 2023-04-18 ENCOUNTER — Ambulatory Visit: Payer: 59 | Admitting: Internal Medicine

## 2023-06-05 ENCOUNTER — Other Ambulatory Visit: Payer: Self-pay | Admitting: Family Medicine

## 2023-06-07 ENCOUNTER — Other Ambulatory Visit: Payer: Self-pay | Admitting: Medical

## 2023-06-07 DIAGNOSIS — I10 Essential (primary) hypertension: Secondary | ICD-10-CM

## 2023-06-07 NOTE — Telephone Encounter (Signed)
 Unable to refill per protocol, appointment needed.   Requested Prescriptions  Pending Prescriptions Disp Refills   B-D UF III MINI PEN NEEDLES 31G X 5 MM MISC [Pharmacy Med Name: BD UF MINI PEN NEEDLE 5MMX31G] 100 each 1    Sig: USE AS DIRECTED AT BEDTIME     Endocrinology: Diabetes - Testing Supplies Failed - 06/07/2023  8:18 AM      Failed - Valid encounter within last 12 months    Recent Outpatient Visits           1 year ago Type 2 diabetes mellitus with stage 4 chronic kidney disease, with long-term current use of insulin  (HCC)   [place] [facility], Selinda PARAS, MD   1 year ago GAD (generalized anxiety disorder)   [place] [facility], Selinda PARAS, MD   1 year ago GAD (generalized anxiety disorder)   [place] [facility], Selinda PARAS, MD   1 year ago Hypertension, unspecified type   [facility], Selinda PARAS, MD   2 years ago Type 2 diabetes mellitus with hyperosmolar hyperglycemic state (HHS) [facility])   [place] [facility] at [facility], Selinda PARAS, MD       Future Appointments             In 1 week End, Lonni, MD [facility]            '

## 2023-06-20 ENCOUNTER — Ambulatory Visit: Payer: 59 | Admitting: Internal Medicine

## 2023-06-24 ENCOUNTER — Ambulatory Visit: Payer: 59 | Attending: Internal Medicine | Admitting: Internal Medicine

## 2023-06-24 NOTE — Progress Notes (Deleted)
  Cardiology Office Note:  .   Date:  06/24/2023  ID:  David Klein, DOB 06/11/1970, MRN [id number] PCP: Jerrol Banana, MD   HeartCare Providers Cardiologist:  Yvonne Kendall, MD { Click to update primary MD,subspecialty MD or APP then REFRESH:1}    History of Present Illness: .   David Klein is a 53 y.o. male with history of chronic HFpEF, hypertension, type 2 diabetes mellitus, chronic kidney disease stage III, and obstructive sleep apnea, who presents for follow-up of HFpEF.  He was last seen in our office in 09/2022 by Cadence [name], PA, at which time he was feeling fairly well without chest pain or shortness of breath.  He noted mild lower extremity edema managed with torsemide.  Home blood pressures were improved following escalation of isosorbide mononitrate in 04/2022.  No medication changes or additional testing were pursued at that time.  ROS: See HPI  Studies Reviewed: Marland Kitchen        TTE (05/18/2021): Normal LV size with moderate LVH.  LVEF 55-60% with normal wall motion and grade 2 diastolic dysfunction.  Normal RV size and function.  Mild left atrial enlargement.  No pericardial effusion.  Mildly thickened mitral valve with mild regurgitation.  Normal CVP.  Renal artery Doppler (05/18/2021): No evidence of renal artery stenosis.  Risk Assessment/Calculations:   {Does this patient have ATRIAL FIBRILLATION?[phone]} No BP recorded.  {Refresh Note OR Click here to enter BP  :1}***       Physical Exam:   VS:  There were no vitals taken for this visit.   Wt Readings from Last 3 Encounters:  10/11/22 228 lb 6 oz (103.6 kg)  05/25/22 230 lb 11.2 oz (104.6 kg)  05/21/22 229 lb 9.6 oz (104.1 kg)    General:  NAD. Neck: No JVD or HJR. Lungs: Clear to auscultation bilaterally without wheezes or crackles. Heart: Regular rate and rhythm without murmurs, rubs, or gallops. Abdomen: Soft, nontender, nondistended. Extremities: No lower extremity edema.  ASSESSMENT  AND PLAN: .    ***    {Are you ordering a CV Procedure (e.g. stress test, cath, DCCV, TEE, etc)?   Press F2        :[id number]}  Dispo: ***  Signed, Yvonne Kendall, MD

## 2023-06-26 ENCOUNTER — Other Ambulatory Visit: Payer: Self-pay | Admitting: Medical

## 2023-06-26 DIAGNOSIS — I10 Essential (primary) hypertension: Secondary | ICD-10-CM

## 2023-07-10 ENCOUNTER — Other Ambulatory Visit: Payer: Self-pay | Admitting: Family Medicine

## 2023-07-10 DIAGNOSIS — E785 Hyperlipidemia, unspecified: Secondary | ICD-10-CM

## 2023-07-10 DIAGNOSIS — N184 Chronic kidney disease, stage 4 (severe): Secondary | ICD-10-CM

## 2023-07-10 DIAGNOSIS — I10 Essential (primary) hypertension: Secondary | ICD-10-CM

## 2023-07-10 DIAGNOSIS — E1122 Type 2 diabetes mellitus with diabetic chronic kidney disease: Secondary | ICD-10-CM

## 2023-07-10 NOTE — Telephone Encounter (Signed)
 Requested medication (s) are due for refill today: yes  Requested medication (s) are on the active medication list: yes  Last refill:  06/05/22  Future visit scheduled: no  Notes to clinic:  Unable to refill per protocol, appointment needed.      Requested Prescriptions  Pending Prescriptions Disp Refills   rosuvastatin (CRESTOR) 40 MG tablet [Pharmacy Med Name: ROSUVASTATIN CALCIUM 40 MG TAB] 90 tablet 2    Sig: TAKE 1 TABLET BY MOUTH EVERY DAY     Cardiovascular:  Antilipid - Statins 2 Failed - 07/10/2023  2:02 PM      Failed - Cr in normal range and within 360 days    Creatinine, Ser  Date Value Ref Range Status  12/18/2021 4.23 (H) 0.76 - 1.27 mg/dL Final   Creatinine, Urine  Date Value Ref Range Status  02/06/2021 73 mg/dL Final         Failed - Valid encounter within last 12 months    Recent Outpatient Visits           1 year ago Type 2 diabetes mellitus with stage 4 chronic kidney disease, with long-term current use of insulin (HCC)   [place] [facility], Ocie Bob, MD   1 year ago GAD (generalized anxiety disorder)   [place] [facility], Ocie Bob, MD   1 year ago GAD (generalized anxiety disorder)   [place] [facility], Ocie Bob, MD   1 year ago Hypertension, unspecified type   [facility] at [facility], Ocie Bob, MD   2 years ago Type 2 diabetes mellitus with hyperosmolar hyperglycemic state (HHS) [facility])   [place] [facility] at [facility], Ocie Bob, MD              Failed - Lipid Panel in normal range within the last 12 months    Cholesterol, Total  Date Value Ref Range Status  12/18/2021 75 (L) 100 - 199 mg/dL Final   LDL Chol Calc (NIH)  Date Value Ref Range Status  12/18/2021 14 0 - 99 mg/dL Final   Direct LDL  Date  Value Ref Range Status  02/05/2021 152.5 (H) 0 - 99 mg/dL Final    Comment:    Performed at [facility], [address], [place], [place] [address]   HDL  Date Value Ref Range Status  12/18/2021 36 (L) >39 mg/dL Final   Triglycerides  Date Value Ref Range Status  12/18/2021 146 0 - 149 mg/dL Final         Passed - Patient is not pregnant

## 2023-07-15 ENCOUNTER — Other Ambulatory Visit: Payer: Self-pay | Admitting: Medical

## 2023-07-15 DIAGNOSIS — I10 Essential (primary) hypertension: Secondary | ICD-10-CM

## 2023-07-15 NOTE — Telephone Encounter (Signed)
 Hi,  Could you schedule this patient a 6 month follow up visit? The patient was last seen by C.[name] on 10-11-2022. Thank you so much.

## 2023-07-15 NOTE — Telephone Encounter (Signed)
 This is a Educational psychologist pt

## 2023-07-16 ENCOUNTER — Telehealth: Payer: Self-pay | Admitting: Internal Medicine

## 2023-07-16 NOTE — Telephone Encounter (Signed)
 Left voicemail, pt needs to schedule appt with Dr. Okey Dupre or Cadence [name], PA-C.

## 2023-07-18 DIAGNOSIS — H2513 Age-related nuclear cataract, bilateral: Secondary | ICD-10-CM | POA: Diagnosis not present

## 2023-07-18 DIAGNOSIS — E119 Type 2 diabetes mellitus without complications: Secondary | ICD-10-CM | POA: Diagnosis not present

## 2023-07-19 DIAGNOSIS — E113213 Type 2 diabetes mellitus with mild nonproliferative diabetic retinopathy with macular edema, bilateral: Secondary | ICD-10-CM | POA: Diagnosis not present

## 2023-07-19 NOTE — Telephone Encounter (Signed)
Pt is scheduled on 5/23.

## 2023-07-30 ENCOUNTER — Encounter: Payer: Self-pay | Admitting: Ophthalmology

## 2023-07-30 DIAGNOSIS — H401121 Primary open-angle glaucoma, left eye, mild stage: Secondary | ICD-10-CM | POA: Diagnosis not present

## 2023-07-30 DIAGNOSIS — H2512 Age-related nuclear cataract, left eye: Secondary | ICD-10-CM | POA: Diagnosis not present

## 2023-07-30 DIAGNOSIS — H2511 Age-related nuclear cataract, right eye: Secondary | ICD-10-CM | POA: Diagnosis not present

## 2023-07-30 DIAGNOSIS — H401111 Primary open-angle glaucoma, right eye, mild stage: Secondary | ICD-10-CM | POA: Diagnosis not present

## 2023-07-30 DIAGNOSIS — H25011 Cortical age-related cataract, right eye: Secondary | ICD-10-CM | POA: Diagnosis not present

## 2023-07-30 NOTE — Anesthesia Preprocedure Evaluation (Addendum)
 Anesthesia Evaluation  Patient identified by MRN, date of birth, ID band Patient awake    Reviewed: Allergy & Precautions, H&P , NPO status , Patient's Chart, lab work & pertinent test results  Airway Mallampati: III  TM Distance: >3 FB Neck ROM: Full    Dental no notable dental hx.    Pulmonary shortness of breath, sleep apnea , former smoker   Pulmonary exam normal breath sounds clear to auscultation       Cardiovascular hypertension, Normal cardiovascular exam Rhythm:Regular Rate:Normal  05-18-21 1. Left ventricular ejection fraction, by estimation, is 55 to 60%. The  left ventricle has normal function. The left ventricle has no regional  wall motion abnormalities. There is mild to moderate left ventricular  hypertrophy. Left ventricular diastolic  parameters are consistent with Grade II diastolic dysfunction  (pseudonormalization).   2. Right ventricular systolic function is normal. The right ventricular  size is normal. Tricuspid regurgitation signal is inadequate for assessing  PA pressure.   3. Left atrial size was mildly dilated.   4. The mitral valve is normal in structure. Mild mitral valve  regurgitation. No evidence of mitral stenosis.   5. The aortic valve was not well visualized. Aortic valve regurgitation  is not visualized. No aortic stenosis is present.   6. The inferior vena cava is normal in size with greater than 50%  respiratory variability, suggesting right atrial pressure of 3 mmHg.     Neuro/Psych  PSYCHIATRIC DISORDERS Anxiety     negative neurological ROS  negative psych ROS   GI/Hepatic negative GI ROS, Neg liver ROS,,,  Endo/Other  diabetes    Renal/GU Renal diseasenegative Renal ROS  negative genitourinary   Musculoskeletal negative musculoskeletal ROS (+)    Abdominal   Peds negative pediatric ROS (+)  Hematology negative hematology ROS (+)   Anesthesia Other  Findings Hypertension  Allergy Hyperlipidemia  Sleep apnea on CPAP Type 2 diabetes mellitus with stage 4 chronic kidney disease, with long-term current use of insulin Type 2 diabetes mellitus with hyperosmolar hyperglycemic state  Morbid obesity  Reproductive/Obstetrics negative OB ROS                             Anesthesia Physical Anesthesia Plan  ASA: 3  Anesthesia Plan: MAC   Post-op Pain Management:    Induction: Intravenous  PONV Risk Score and Plan:   Airway Management Planned: Natural Airway and Nasal Cannula  Additional Equipment:   Intra-op Plan:   Post-operative Plan:   Informed Consent: I have reviewed the patients History and Physical, chart, labs and discussed the procedure including the risks, benefits and alternatives for the proposed anesthesia with the patient or authorized representative who has indicated his/her understanding and acceptance.     Dental Advisory Given  Plan Discussed with: Anesthesiologist, CRNA and Surgeon  Anesthesia Plan Comments: (Patient consented for risks of anesthesia including but not limited to:  - adverse reactions to medications - damage to eyes, teeth, lips or other oral mucosa - nerve damage due to positioning  - sore throat or hoarseness - Damage to heart, brain, nerves, lungs, other parts of body or loss of life  Patient voiced understanding and assent.)       Anesthesia Quick Evaluation

## 2023-08-02 DIAGNOSIS — E1122 Type 2 diabetes mellitus with diabetic chronic kidney disease: Secondary | ICD-10-CM | POA: Diagnosis not present

## 2023-08-02 DIAGNOSIS — Z794 Long term (current) use of insulin: Secondary | ICD-10-CM | POA: Diagnosis not present

## 2023-08-02 DIAGNOSIS — N1831 Chronic kidney disease, stage 3a: Secondary | ICD-10-CM | POA: Diagnosis not present

## 2023-08-07 NOTE — Discharge Instructions (Signed)

## 2023-08-08 ENCOUNTER — Encounter: Payer: Self-pay | Admitting: Ophthalmology

## 2023-08-08 ENCOUNTER — Ambulatory Visit: Payer: Self-pay | Admitting: Anesthesiology

## 2023-08-08 ENCOUNTER — Other Ambulatory Visit: Payer: Self-pay

## 2023-08-08 ENCOUNTER — Telehealth: Payer: Self-pay | Admitting: Medical

## 2023-08-08 ENCOUNTER — Ambulatory Visit
Admission: RE | Admit: 2023-08-08 | Discharge: 2023-08-08 | Disposition: A | Discharge status: Home / Self Care | Attending: Ophthalmology | Admitting: Ophthalmology

## 2023-08-08 ENCOUNTER — Encounter
Admission: RE | Disposition: A | Discharge status: Home / Self Care | Payer: Self-pay | Source: Home / Self Care | Attending: Ophthalmology

## 2023-08-08 DIAGNOSIS — H25041 Posterior subcapsular polar age-related cataract, right eye: Secondary | ICD-10-CM | POA: Diagnosis not present

## 2023-08-08 DIAGNOSIS — Z6839 Body mass index (BMI) 39.0-39.9, adult: Secondary | ICD-10-CM | POA: Insufficient documentation

## 2023-08-08 DIAGNOSIS — I129 Hypertensive chronic kidney disease with stage 1 through stage 4 chronic kidney disease, or unspecified chronic kidney disease: Secondary | ICD-10-CM | POA: Diagnosis not present

## 2023-08-08 DIAGNOSIS — E1122 Type 2 diabetes mellitus with diabetic chronic kidney disease: Secondary | ICD-10-CM | POA: Diagnosis not present

## 2023-08-08 DIAGNOSIS — G4730 Sleep apnea, unspecified: Secondary | ICD-10-CM | POA: Insufficient documentation

## 2023-08-08 DIAGNOSIS — Z539 Procedure and treatment not carried out, unspecified reason: Secondary | ICD-10-CM | POA: Insufficient documentation

## 2023-08-08 DIAGNOSIS — E1136 Type 2 diabetes mellitus with diabetic cataract: Secondary | ICD-10-CM | POA: Insufficient documentation

## 2023-08-08 DIAGNOSIS — H401111 Primary open-angle glaucoma, right eye, mild stage: Secondary | ICD-10-CM | POA: Diagnosis not present

## 2023-08-08 DIAGNOSIS — Z87891 Personal history of nicotine dependence: Secondary | ICD-10-CM | POA: Diagnosis not present

## 2023-08-08 DIAGNOSIS — I10 Essential (primary) hypertension: Secondary | ICD-10-CM | POA: Diagnosis not present

## 2023-08-08 DIAGNOSIS — H25011 Cortical age-related cataract, right eye: Secondary | ICD-10-CM | POA: Diagnosis not present

## 2023-08-08 DIAGNOSIS — N184 Chronic kidney disease, stage 4 (severe): Secondary | ICD-10-CM | POA: Insufficient documentation

## 2023-08-08 DIAGNOSIS — H2511 Age-related nuclear cataract, right eye: Secondary | ICD-10-CM | POA: Diagnosis not present

## 2023-08-08 HISTORY — DX: Nonrheumatic mitral (valve) insufficiency: I34.0

## 2023-08-08 HISTORY — DX: Type 2 diabetes mellitus with diabetic chronic kidney disease: E11.22

## 2023-08-08 HISTORY — DX: Morbid (severe) obesity due to excess calories: E66.01

## 2023-08-08 HISTORY — DX: Other ill-defined heart diseases: I51.89

## 2023-08-08 HISTORY — DX: Type 2 diabetes mellitus with hyperosmolarity without nonketotic hyperglycemic-hyperosmolar coma (NKHHC): E11.00

## 2023-08-08 HISTORY — DX: Body Mass Index (BMI) 40.0 and over, adult: Z684

## 2023-08-08 LAB — GLUCOSE, CAPILLARY: Glucose-Capillary: 187 mg/dL — ABNORMAL HIGH (ref 70–99)

## 2023-08-08 SURGERY — CANCELLED PROCEDURE
Anesthesia: Monitor Anesthesia Care

## 2023-08-08 MED ORDER — TETRACAINE HCL 0.5 % OP SOLN
1.0000 [drp] | OPHTHALMIC | Status: DC | PRN
  Administered 2023-08-08 (×3): 1 [drp] via OPHTHALMIC

## 2023-08-08 MED ORDER — FENTANYL CITRATE (PF) 100 MCG/2ML IJ SOLN
INTRAMUSCULAR | Status: AC
  Filled 2023-08-08: qty 2

## 2023-08-08 MED ORDER — TETRACAINE HCL 0.5 % OP SOLN
OPHTHALMIC | Status: AC
Start: 2023-08-08 — End: ?
  Filled 2023-08-08: qty 4

## 2023-08-08 MED ORDER — MIDAZOLAM HCL 5 MG/5ML IJ SOLN
INTRAMUSCULAR | Status: DC | PRN
  Administered 2023-08-08: 1 mg via INTRAVENOUS

## 2023-08-08 MED ORDER — FENTANYL CITRATE (PF) 100 MCG/2ML IJ SOLN
INTRAMUSCULAR | Status: DC | PRN
  Administered 2023-08-08: 50 ug via INTRAVENOUS

## 2023-08-08 MED ORDER — MIDAZOLAM HCL 2 MG/2ML IJ SOLN
INTRAMUSCULAR | Status: AC
Start: 2023-08-08 — End: ?
  Filled 2023-08-08: qty 2

## 2023-08-08 MED ORDER — ARMC OPHTHALMIC DILATING DROPS
1.0000 | OPHTHALMIC | Status: DC | PRN
  Administered 2023-08-08 (×3): 1 via OPHTHALMIC

## 2023-08-08 MED ORDER — ARMC OPHTHALMIC DILATING DROPS
OPHTHALMIC | Status: AC
  Filled 2023-08-08: qty 0.5

## 2023-08-08 SURGICAL SUPPLY — 21 items
BNDG EYE OVAL 2 1/8 X 2 5/8 (GAUZE/BANDAGES/DRESSINGS) IMPLANT
CANNULA ANT/CHMB 27G (MISCELLANEOUS) IMPLANT
CANNULA ANT/CHMB 27GA (MISCELLANEOUS) IMPLANT
CATARACT SUITE SIGHTPATH (MISCELLANEOUS) ×1 IMPLANT
DISSECTOR HYDRO NUCLEUS 50X22 (MISCELLANEOUS) ×2 IMPLANT
DRSG TEGADERM 2-3/8X2-3/4 SM (GAUZE/BANDAGES/DRESSINGS) ×2 IMPLANT
FEE CATARACT SUITE SIGHTPATH (MISCELLANEOUS) ×1 IMPLANT
GLOVE BIOGEL PI IND STRL 8 (GLOVE) ×2 IMPLANT
GLOVE PI ULTRA LF STRL 7.5 (GLOVE) IMPLANT
GLOVE SURG LX STRL 7.5 STRW (GLOVE) ×2 IMPLANT
GLOVE SURG PROTEXIS BL SZ6.5 (GLOVE) ×1 IMPLANT
GLOVE SURG SYN 6.5 PF PI BL (GLOVE) ×1 IMPLANT
NDL FILTER BLUNT 18X1 1/2 (NEEDLE) ×1 IMPLANT
NEEDLE FILTER BLUNT 18X1 1/2 (NEEDLE) ×1 IMPLANT
PACK VIT ANT 23G (MISCELLANEOUS) IMPLANT
RING MALYGIN (MISCELLANEOUS) IMPLANT
RING MALYGIN 7.0 (MISCELLANEOUS) IMPLANT
SUT ETHILON 10-0 CS-B-6CS-B-6 (SUTURE) IMPLANT
SUTURE EHLN 10-0 CS-B-6CS-B-6 (SUTURE) IMPLANT
SYR 3ML LL SCALE MARK (SYRINGE) ×2 IMPLANT
SYR 5ML LL (SYRINGE) IMPLANT

## 2023-08-08 NOTE — Progress Notes (Signed)
 Called patient's cardiology provider Cadence Cullen, [place], [phone]. I spoke with nurse [name]. I explained patient's high blood pressures, and that patient took morning BP meds, but then vomited the meds.  (Nausea and vomiting may be one sign of hypertensive crisis. Patient is not nauseated at present. Patient thinks he may have become nauseated due to the meds, which he usually takes after breakfast, but could not eat today due to being NPO for surgery. Patient then came to [facility]. Denied any further nausea. Patient's BP elevated preop, 190s/90s that I saw. Intra-op, Patient received versed 1 mg IV and fentanyl 50 mcg IV.  However, intra-op, BP as high as 227/118. Discussed risks with Dr. [name], patient to have goniotomy, which may bleed with high blood pressures and potentially cause blindness.  Need to have BP controlled, not only intra-op, but postop, and this pressure is consistent with hypertensive crisis, without other symptomology.  Surgery cancelled til BP better controlled. Ms. [name] informed me that there is no appointment available "today or tomorrow."  I asked [name] to please notify Ms. [name], and see if patient could be worked in. I am concerned about hypertensive crisis. Ms. [name] stated  "maybe he needs to go to ER."  Discussed w/patient and wife, discussed risks untreated hypertension, especially in this case with hypertensive crisis levels BP.  BP now 202/101 in PACU.  Patient does not want to go to ER, and sit for hours awaiting even being seen, let alone treatment. Ms. [name] had stated, (per my request) that she would discussed w/[name] to see if Ms. [name] could work patient in, and that Ms. [name] would call patient later to discuss whether he can be seen there at some point, and when. I informed patient that if he had any symptoms of hypertensive crisis, he needed to go straight to ER or call ambulance. Wife is with patient and she was also part of the discussion.   Cataract surgery/goniotomy can be scheduled later when BP is better controlled.  Patient and wife are very intelligent and reported they understood instructions and discussion.

## 2023-08-08 NOTE — Telephone Encounter (Signed)
 Dr. Juel Burrow called in asking to speak with provider. Pt BP is too high however is supposed to having surgery today, please advise. Call transferred

## 2023-08-08 NOTE — Transfer of Care (Signed)
 Immediate Anesthesia Transfer of Care Note  Patient: [name]  Procedure(s) Performed: PHACOEMULSIFICATION, CATARACT, WITH IOL INSERTION (Right: Eye) GONIOTOMY (Right: Eye)  Patient Location: PACU  Anesthesia Type: MAC  Level of Consciousness: awake, alert  and patient cooperative  Airway and Oxygen Therapy: Patient Spontanous Breathing and Patient connected to supplemental oxygen  Post-op Assessment: Post-op Vital signs reviewed, Patient's Cardiovascular Status Stable, Respiratory Function Stable, Patent Airway and No signs of Nausea or vomiting  Post-op Vital Signs: Reviewed and stable  Complications: No notable events documented.

## 2023-08-08 NOTE — Telephone Encounter (Signed)
 Left a message for the patient to call back.     [name], Cadence H, PA-C  Jani Gravel, RN2 hours ago (11:55 AM)    He should go to the ER if he has high BP and is vomiting.

## 2023-08-08 NOTE — Anesthesia Postprocedure Evaluation (Signed)
 Anesthesia Post Note  Patient: David Klein  Procedure(s) Performed: CANCELLED PROCEDURE (Right: Eye)  Anesthesia Type: MAC Anesthetic complications: no Comments: Called patient's cardiology provider [name], [place], [phone]. I spoke with nurse [name]. I explained patient's high blood pressures, and that patient took morning BP meds, but then vomited the meds.  (Nausea and vomiting may be one sign of hypertensive crisis. Patient is not nauseated at present. Patient thinks he may have become nauseated due to the meds, which he usually takes after breakfast, but could not eat today due to being NPO for surgery. Patient then came to [facility]. Denied any further nausea. Patient's BP elevated preop, 190s/90s that I saw. Intra-op, Patient received versed 1 mg IV and fentanyl 50 mcg IV.  However, intra-op, BP as high as 227/118. Discussed risks with Dr. [name], patient to have goniotomy, which may bleed with high blood pressures and potentially cause blindness.  Need to have BP controlled, not only intra-op, but postop, and this pressure is consistent with hypertensive crisis, without other symptomology.  Surgery cancelled til BP better controlled. Ms. [name] informed me that there is no appointment available "today or tomorrow."  I asked [name] to please notify Ms. [name], and see if patient could be worked in. I am concerned about hypertensive crisis. Ms. [name] stated  "maybe he needs to go to ER."  Discussed w/patient and wife, discussed risks untreated hypertension, especially in this case with hypertensive crisis levels BP.  BP now 202/101 in PACU.  Patient does not want to go to ER, and sit for hours awaiting even being seen, let alone treatment. Ms. [name] had stated, (per my request) that she would discussed w/[name] to see if Ms. [name] could work patient in, and that Ms. [name] would call patient later to discuss whether he can be seen there at some point, and when. I  informed patient that if he had any symptoms of hypertensive crisis, he needed to go straight to ER or call ambulance. Wife is with patient and she was also part of the discussion.  Cataract surgery/goniotomy can be scheduled later when BP is better controlled.  Patient and wife are very intelligent and reported they understood instructions and discussion.    No notable events documented.   Last Vitals:  Vitals:   08/08/23 1055 08/08/23 1100  BP: (!) 214/105 (!) 211/102  Pulse: 78 76  Resp: (!) 21 15  Temp: 36.5 C   SpO2: 96% 97%    Last Pain:  Vitals:   08/08/23 1055  TempSrc:   PainSc: 0-No pain                 Carlicia Leavens C Elanore Talcott

## 2023-08-08 NOTE — Telephone Encounter (Signed)
 Spoke with Dr. Juel Burrow. Patient was scheduled for cataract surgery this morning. Upon arrival patient's blood pressure was 190's/90's. Dr. Juel Burrow reported that blood pressure increased to 227/118. Dr. Juel Burrow reported that patient stated that he took his blood pressure medications this am but he vomited afterwards. Per Dr. Juel Burrow patient is asymptomatic. Dr. Juel Burrow requesting recommendations from Cadence [name], PA-C and would like patient to be seen in clinic. Dr. Juel Burrow informed that there are no appointments available this week. Will forward to provider for further recommendations.

## 2023-08-08 NOTE — H&P (Signed)
 [facility]   Primary Care Physician:  Jerrol Banana, MD Ophthalmologist: Dr. Deberah Pelton  Pre-Procedure History & Physical: HPI:  David Klein is a 53 y.o. male here for cataract surgery.   Past Medical History:  Diagnosis Date   Allergy    Hyperlipidemia    Hypertension    Sleep apnea    CPAP   Type 2 diabetes mellitus with hyperosmolar hyperglycemic state (HHS) (HCC)    Type 2 diabetes mellitus with stage 4 chronic kidney disease, with long-term current use of insulin (HCC)     Past Surgical History:  Procedure Laterality Date   COLONOSCOPY WITH PROPOFOL N/A 03/07/2022   Procedure: COLONOSCOPY WITH PROPOFOL;  Surgeon: Toney Reil, MD;  Location: [facility] ENDOSCOPY;  Service: Gastroenterology;  Laterality: N/A;   TOOTH EXTRACTION     WISDOM TOOTH EXTRACTION Bilateral     Prior to Admission medications   Medication Sig Start Date End Date Taking? Authorizing Provider  aspirin 81 MG EC tablet Take 1 tablet (81 mg total) by mouth daily. 02/05/21  Yes Arnetha Courser, MD  carvedilol (COREG) 25 MG tablet Take 1 tablet (25 mg total) by mouth 2 (two) times daily. Pt needs to make appt with provider for additional refills - 1st attempt 07/16/23  Yes [name], Cadence H, PA-C  hydrALAZINE (APRESOLINE) 100 MG tablet TAKE 1 TABLET BY MOUTH THREE TIMES A DAY 06/27/23  Yes [name], Cadence H, PA-C  insulin glargine (LANTUS SOLOSTAR) 100 UNIT/ML Solostar Pen Inject 40 Units into the skin every morning. And pen needles 1/day. 06/23/21  Yes Romero Belling, MD  isosorbide mononitrate (IMDUR) 60 MG 24 hr tablet TAKE 1 TABLET BY MOUTH EVERY DAY 11/15/22  Yes [name], Cadence H, PA-C  loratadine (CLARITIN) 10 MG tablet Take 10 mg by mouth daily.   Yes [provider]  rosuvastatin (CRESTOR) 40 MG tablet TAKE 1 TABLET BY MOUTH EVERY DAY 06/05/22  Yes Jerrol Banana, MD  Semaglutide, 1 MG/DOSE, (OZEMPIC, 1 MG/DOSE,) 2 MG/1.5ML SOPN Inject 1 mg into the skin once a week.   Yes  [provider]  torsemide (DEMADEX) 20 MG tablet TAKE 1 TABLET BY MOUTH TWICE A DAY 06/07/23  Yes [name], Cadence H, PA-C  B-D UF III MINI PEN NEEDLES 31G X 5 MM MISC USE AS DIRECTED AT BEDTIME 09/04/22   Jerrol Banana, MD  blood glucose meter kit and supplies KIT Dispense based on patient and insurance preference. Use up to four times daily as directed. Patient taking differently: Inject 1 each into the skin See admin instructions. Dispense based on patient and insurance preference. Use up to four times daily as directed. 02/05/21   Arnetha Courser, MD  Continuous Blood Gluc Sensor (FREESTYLE LIBRE 2 SENSOR) MISC 1 Device by Does not apply route every 14 (fourteen) days. 05/09/22   Jerrol Banana, MD    Allergies as of 07/23/2023   (No Known Allergies)    Family History  Problem Relation Age of Onset   Hypertension Mother    Hypertension Father    Lung cancer Father    Diabetes Sister    Hypertension Sister    Diabetes Sister    Hypertension Sister    Hypertension Brother    Stroke Paternal Grandmother     Social History   Socioeconomic History   Marital status: Single    Spouse name: Not on file   Number of children: 0   Years of education: 18   Highest education level:  Master's degree (e.g., MA, MS, MEng, MEd, MSW, MBA)  Occupational History   Not on file  Tobacco Use   Smoking status: Former    Types: Cigars   Smokeless tobacco: Never   Tobacco comments:    No cigars in over 1 year  Vaping Use   Vaping status: Never Used  Substance and Sexual Activity   Alcohol use: Yes    Comment: Few drinks every few months.   Drug use: Never   Sexual activity: Not Currently    Partners: Female  Other Topics Concern   Not on file  Social History Narrative   Not on file   Social Drivers of Health   Financial Resource Strain: Not on file  Food Insecurity: No Food Insecurity (02/05/2022)   Hunger Vital Sign    Worried About Running Out of Food in the Last Year:  Never true    Ran Out of Food in the Last Year: Never true  Transportation Needs: No Transportation Needs (02/05/2022)   PRAPARE - Administrator, Civil Service (Medical): No    Lack of Transportation (Non-Medical): No  Physical Activity: Not on file  Stress: Not on file  Social Connections: Not on file  Intimate Partner Violence: Not At Risk (02/05/2022)   Humiliation, Afraid, Rape, and Kick questionnaire    Fear of Current or Ex-Partner: No    Emotionally Abused: No    Physically Abused: No    Sexually Abused: No    Review of Systems: See HPI, otherwise negative ROS  Physical Exam: Ht 5\' 4"  (1.626 m)   Wt 99.8 kg   BMI 37.76 kg/m  General:   Alert, cooperative in NAD Head:  Normocephalic and atraumatic. Respiratory:  Normal work of breathing. Cardiovascular:  RRR  Impression/Plan: [name] is here for cataract surgery.  Risks, benefits, limitations, and alternatives regarding cataract surgery have been reviewed with the patient.  Questions have been answered.  All parties agreeable.   Estanislado Pandy, MD  08/08/2023, 7:11 AM

## 2023-08-09 NOTE — Telephone Encounter (Signed)
Attempted to contact pt x 2.  Left a message for the patient to call back.

## 2023-08-11 DIAGNOSIS — G4733 Obstructive sleep apnea (adult) (pediatric): Secondary | ICD-10-CM | POA: Diagnosis not present

## 2023-08-12 ENCOUNTER — Ambulatory Visit: Admitting: Family Medicine

## 2023-08-12 ENCOUNTER — Telehealth: Payer: Self-pay

## 2023-08-12 NOTE — Telephone Encounter (Signed)
 Spoke to patient regarding appt 08/14/23 with Rebekah Canada- Cataract Surgery was cancelled after elevated BP. Patient had rough night prior and said BP is fine but they require visit with PCP in order to reschedule procedure.

## 2023-08-13 ENCOUNTER — Telehealth: Payer: Self-pay | Admitting: Family Medicine

## 2023-08-14 ENCOUNTER — Telehealth: Payer: Self-pay | Admitting: Internal Medicine

## 2023-08-14 ENCOUNTER — Ambulatory Visit: Admitting: Family Medicine

## 2023-08-14 ENCOUNTER — Telehealth: Payer: Self-pay

## 2023-08-14 NOTE — Telephone Encounter (Signed)
 Spoke with patient and informed him that we have got him a sooner appt with cardiologist. New appt is May 9 at 8 AM with Dr. END. I also let him know that he is still on waiting ;ist just in case they have a cancellation and he can be seen sooner than May 9.  JM

## 2023-08-14 NOTE — Telephone Encounter (Signed)
 Pt returning call to a nurse

## 2023-08-14 NOTE — Telephone Encounter (Signed)
 Left patient a VM to call back. He also has a appt on 05/23 with Cardiology. Does he need a sooner appt with them?

## 2023-08-14 NOTE — Telephone Encounter (Signed)
 Returned the call to the patient. He has been advised of the following. He stated that he is seeing PCP tomorrow concerning his elevated blood pressures and is currently asymptomatic.   Blood pressure this morning was 150/87.   Per previous phone call: Left a message for the patient to call back.       [name], Cadence H, PA-C  Elvia Hammans, RN2 hours ago (11:55 AM)      He should go to the ER if he has high BP and is vomiting.

## 2023-08-14 NOTE — Telephone Encounter (Signed)
 May 9 at 8 am [place] office. Will keep him on cancellation list to get him in sooner.

## 2023-08-14 NOTE — Telephone Encounter (Signed)
 Called patient and left message for call back.

## 2023-08-15 ENCOUNTER — Ambulatory Visit: Admitting: Family Medicine

## 2023-08-16 DIAGNOSIS — R801 Persistent proteinuria, unspecified: Secondary | ICD-10-CM | POA: Diagnosis not present

## 2023-08-16 DIAGNOSIS — E1100 Type 2 diabetes mellitus with hyperosmolarity without nonketotic hyperglycemic-hyperosmolar coma (NKHHC): Secondary | ICD-10-CM | POA: Diagnosis not present

## 2023-08-16 DIAGNOSIS — Z794 Long term (current) use of insulin: Secondary | ICD-10-CM | POA: Diagnosis not present

## 2023-08-16 DIAGNOSIS — I10 Essential (primary) hypertension: Secondary | ICD-10-CM | POA: Diagnosis not present

## 2023-08-16 DIAGNOSIS — E1122 Type 2 diabetes mellitus with diabetic chronic kidney disease: Secondary | ICD-10-CM | POA: Diagnosis not present

## 2023-08-16 DIAGNOSIS — N179 Acute kidney failure, unspecified: Secondary | ICD-10-CM | POA: Diagnosis not present

## 2023-08-16 DIAGNOSIS — N184 Chronic kidney disease, stage 4 (severe): Secondary | ICD-10-CM | POA: Diagnosis not present

## 2023-08-16 DIAGNOSIS — N1831 Chronic kidney disease, stage 3a: Secondary | ICD-10-CM | POA: Diagnosis not present

## 2023-08-22 ENCOUNTER — Ambulatory Visit: Admission: RE | Admit: 2023-08-22 | Source: Home / Self Care | Admitting: Ophthalmology

## 2023-08-22 ENCOUNTER — Encounter: Admission: RE | Payer: Self-pay | Source: Home / Self Care

## 2023-08-22 SURGERY — PHACOEMULSIFICATION, CATARACT, WITH IOL INSERTION
Anesthesia: Topical | Laterality: Left

## 2023-09-01 ENCOUNTER — Other Ambulatory Visit: Payer: Self-pay | Admitting: Medical

## 2023-09-01 DIAGNOSIS — I10 Essential (primary) hypertension: Secondary | ICD-10-CM

## 2023-09-02 DIAGNOSIS — H25011 Cortical age-related cataract, right eye: Secondary | ICD-10-CM | POA: Diagnosis not present

## 2023-09-02 DIAGNOSIS — H25012 Cortical age-related cataract, left eye: Secondary | ICD-10-CM | POA: Diagnosis not present

## 2023-09-02 DIAGNOSIS — E113292 Type 2 diabetes mellitus with mild nonproliferative diabetic retinopathy without macular edema, left eye: Secondary | ICD-10-CM | POA: Diagnosis not present

## 2023-09-02 DIAGNOSIS — E113291 Type 2 diabetes mellitus with mild nonproliferative diabetic retinopathy without macular edema, right eye: Secondary | ICD-10-CM | POA: Diagnosis not present

## 2023-09-06 ENCOUNTER — Other Ambulatory Visit: Payer: Self-pay

## 2023-09-06 ENCOUNTER — Ambulatory Visit: Attending: Internal Medicine | Admitting: Internal Medicine

## 2023-09-06 VITALS — BP 158/80 | HR 76 | Ht 64.0 in | Wt 222.2 lb

## 2023-09-06 DIAGNOSIS — E1169 Type 2 diabetes mellitus with other specified complication: Secondary | ICD-10-CM

## 2023-09-06 DIAGNOSIS — I10 Essential (primary) hypertension: Secondary | ICD-10-CM | POA: Diagnosis not present

## 2023-09-06 DIAGNOSIS — I5032 Chronic diastolic (congestive) heart failure: Secondary | ICD-10-CM

## 2023-09-06 DIAGNOSIS — E785 Hyperlipidemia, unspecified: Secondary | ICD-10-CM

## 2023-09-06 NOTE — Patient Instructions (Signed)
 Medication Instructions:  Your Physician recommend you continue on your current medication as directed.    *If you need a refill on your cardiac medications before your next appointment, please call your pharmacy*  Lab Work: Your provider would like for you to have following labs drawn today BMP and Aldosterone + renin.   If you have labs (blood work) drawn today and your tests are completely normal, you will receive your results only by: MyChart Message (if you have MyChart) OR A paper copy in the mail If you have any lab test that is abnormal or we need to change your treatment, we will call you to review the results.  Testing/Procedures: No test ordered today   Follow-Up: At [facility], you and your health needs are our priority.  As part of our continuing mission to provide you with exceptional heart care, our providers are all part of one team.  This team includes your primary Cardiologist (physician) and Advanced Practice Providers or APPs (Physician Assistants and Nurse Practitioners) who all work together to provide you with the care you need, when you need it.  Your next appointment:   1 month(s)  Provider:   You will see one of the following Advanced Practice Providers on your designated Care Team:   Laneta Pintos, NP Gildardo Labrador, PA-C Varney Gentleman, PA-C Cadence [place], PA-C Ronald Cockayne, NP Morey Ar, NP

## 2023-09-06 NOTE — Progress Notes (Unsigned)
  Cardiology Office Note:  .   Date:  09/06/2023  ID:  David Klein, DOB 1970-08-01, MRN [id number] PCP: Ma Saupe, MD  [facility] Providers Cardiologist:  Sammy Crisp, MD { Click to update primary MD,subspecialty MD or APP then REFRESH:1}    History of Present Illness: .   David Klein is a 53 y.o. male with history of HFpEF, hypertension, type 2 diabetes mellitus, CKD stage 4, and obstructive sleep apnea, who presents for follow-up of HFpEF.  He was last seen in our office in 09/2022 by Cadence [name], PA, at which time he was feeling well other than mild lower extremity edema managed with torsemide .  Blood pressures were also better than at prior visits.  No medication changes or additional testing were pursued.  Had to cancel cataract surgery due to high Bp.  Vomiting beforehand.  Wonders if he has insomnia.  Using CPAP.  No CP, SOB.  Let swelling much improved.  Met with endocrine a few weeks ago.  Not checking BP regularly at home.  Recently put on losartan  12.5 mg daily.  Has increased exercise some.  ROS: See HPI  Studies Reviewed: .        *** Risk Assessment/Calculations:   {Does this patient have ATRIAL FIBRILLATION?:[phone]} HYPERTENSION CONTROL Vitals:   09/06/23 0812 09/06/23 0827  BP: (!) 182/92 (!) 158/80    The patient's blood pressure is elevated above target today. {Click here to indicate intervention taken to address high BP   Refresh Note :1} *** In order to address the patient's elevated BP:             Physical Exam:   VS:  BP (!) 158/80 (BP Location: Left Arm, Patient Position: Sitting, Cuff Size: Large)   Pulse 76   Ht 5\' 4"  (1.626 m)   Wt 222 lb 3.2 oz (100.8 kg)   SpO2 97%   BMI 38.14 kg/m    Wt Readings from Last 3 Encounters:  09/06/23 222 lb 3.2 oz (100.8 kg)  08/08/23 230 lb (104.3 kg)  10/11/22 228 lb 6 oz (103.6 kg)    General:  NAD. Neck: No JVD or HJR. Lungs: Clear to auscultation bilaterally without  wheezes or crackles. Heart: Regular rate and rhythm without murmurs, rubs, or gallops. Abdomen: Soft, nontender, nondistended. Extremities: No lower extremity edema.  ASSESSMENT AND PLAN: .    ***    {Are you ordering a CV Procedure (e.g. stress test, cath, DCCV, TEE, etc)?   Press F2        :[id number]}  Dispo: ***  Signed, Sammy Crisp, MD

## 2023-09-08 ENCOUNTER — Encounter: Payer: Self-pay | Admitting: Internal Medicine

## 2023-09-08 DIAGNOSIS — I5032 Chronic diastolic (congestive) heart failure: Secondary | ICD-10-CM | POA: Insufficient documentation

## 2023-09-09 ENCOUNTER — Other Ambulatory Visit: Payer: Self-pay

## 2023-09-09 MED ORDER — ISOSORBIDE MONONITRATE ER 60 MG PO TB24: 60.0000 mg | ORAL_TABLET | Freq: Two times a day (BID) | ORAL | 3 refills | Status: AC

## 2023-09-10 LAB — BASIC METABOLIC PANEL WITH GFR
BUN/Creatinine Ratio: 12 (ref 9–20)
BUN: 70 mg/dL — ABNORMAL HIGH (ref 6–24)
CO2: 20 mmol/L (ref 20–29)
Calcium: 9.7 mg/dL (ref 8.7–10.2)
Chloride: 97 mmol/L (ref 96–106)
Creatinine, Ser: 5.78 mg/dL — ABNORMAL HIGH (ref 0.76–1.27)
Glucose: 178 mg/dL — ABNORMAL HIGH (ref 70–99)
Potassium: 4 mmol/L (ref 3.5–5.2)
Sodium: 139 mmol/L (ref 134–144)
eGFR: 11 mL/min/{1.73_m2} — ABNORMAL LOW (ref >59)

## 2023-09-10 LAB — ALDOSTERONE + RENIN ACTIVITY W/ RATIO
Aldos/Renin Ratio: 0.5 (ref 0.0–30.0)
Aldosterone: 7.2 ng/dL (ref 0.0–30.0)
Renin Activity, Plasma: 15.615 ng/mL/h — ABNORMAL HIGH (ref 0.167–5.380)

## 2023-09-12 ENCOUNTER — Ambulatory Visit: Payer: Self-pay | Admitting: Internal Medicine

## 2023-09-13 ENCOUNTER — Other Ambulatory Visit: Payer: Self-pay

## 2023-09-13 ENCOUNTER — Encounter: Payer: Self-pay | Admitting: Ophthalmology

## 2023-09-13 NOTE — Discharge Instructions (Signed)

## 2023-09-13 NOTE — Anesthesia Preprocedure Evaluation (Addendum)
 Anesthesia Evaluation  Patient identified by MRN, date of birth, ID band Patient awake    Reviewed: Allergy & Precautions, H&P , NPO status , Patient's Chart, lab work & pertinent test results  Airway Mallampati: III  TM Distance: >3 FB Neck ROM: Full    Dental no notable dental hx.    Pulmonary shortness of breath, sleep apnea , former smoker   Pulmonary exam normal breath sounds clear to auscultation       Cardiovascular hypertension, Normal cardiovascular exam Rhythm:Regular Rate:Normal  05-18-21 echo  Patient is morbidly obese.  IMPRESSIONS     1. Left ventricular ejection fraction, by estimation, is 55 to 60%. The  left ventricle has normal function. The left ventricle has no regional  wall motion abnormalities. There is mild to moderate left ventricular  hypertrophy. Left ventricular diastolic  parameters are consistent with Grade II diastolic dysfunction  (pseudonormalization).   2. Right ventricular systolic function is normal. The right ventricular  size is normal. Tricuspid regurgitation signal is inadequate for assessing  PA pressure.   3. Left atrial size was mildly dilated.   4. The mitral valve is normal in structure. Mild mitral valve  regurgitation. No evidence of mitral stenosis.   5. The aortic valve was not well visualized. Aortic valve regurgitation  is not visualized. No aortic stenosis is present.   6. The inferior vena cava is normal in size with greater than 50%  respiratory variability, suggesting right atrial pressure of 3 mmHg.     Neuro/Psych  PSYCHIATRIC DISORDERS Anxiety     negative neurological ROS  negative psych ROS   GI/Hepatic negative GI ROS, Neg liver ROS,,,  Endo/Other  diabetes    Renal/GU Renal diseasenegative Renal ROS  negative genitourinary   Musculoskeletal negative musculoskeletal ROS (+)    Abdominal   Peds negative pediatric ROS (+)  Hematology negative  hematology ROS (+)   Anesthesia Other Findings Hypertension  Allergy Hyperlipidemia  Sleep apnea Type 2 diabetes mellitus with stage 4 chronic kidney disease, with long-term current use of insulin    Type 2 diabetes mellitus with hyperosmolar hyperglycemic state Grade II diastolic dysfunction  Mild mitral regurgitation by prior echocardiogram Morbid obesity with BMI of 40.0-44.9, adult  Grade II diastolic dysfunction  States he saw cardiologist  and cardiologist took BP manually and BP was 150/90 area.   Reproductive/Obstetrics negative OB ROS                             Anesthesia Physical Anesthesia Plan  ASA: 3  Anesthesia Plan: MAC   Post-op Pain Management:    Induction: Intravenous  PONV Risk Score and Plan:   Airway Management Planned: Natural Airway and Nasal Cannula  Additional Equipment:   Intra-op Plan:   Post-operative Plan:   Informed Consent: I have reviewed the patients History and Physical, chart, labs and discussed the procedure including the risks, benefits and alternatives for the proposed anesthesia with the patient or authorized representative who has indicated his/her understanding and acceptance.     Dental Advisory Given  Plan Discussed with: Anesthesiologist, CRNA and Surgeon  Anesthesia Plan Comments: (Patient consented for risks of anesthesia including but not limited to:  - adverse reactions to medications - damage to eyes, teeth, lips or other oral mucosa - nerve damage due to positioning  - sore throat or hoarseness - Damage to heart, brain, nerves, lungs, other parts of body or loss of life  Patient  voiced understanding and assent.)        Anesthesia Quick Evaluation

## 2023-09-19 ENCOUNTER — Other Ambulatory Visit: Payer: Self-pay

## 2023-09-19 ENCOUNTER — Encounter
Admission: RE | Disposition: A | Discharge status: Home / Self Care | Payer: Self-pay | Source: Home / Self Care | Attending: Ophthalmology

## 2023-09-19 ENCOUNTER — Encounter: Payer: Self-pay | Admitting: Ophthalmology

## 2023-09-19 ENCOUNTER — Ambulatory Visit: Payer: Self-pay | Admitting: Anesthesiology

## 2023-09-19 ENCOUNTER — Ambulatory Visit
Admission: RE | Admit: 2023-09-19 | Discharge: 2023-09-19 | Disposition: A | Discharge status: Home / Self Care | Attending: Ophthalmology | Admitting: Ophthalmology

## 2023-09-19 DIAGNOSIS — H2511 Age-related nuclear cataract, right eye: Secondary | ICD-10-CM | POA: Insufficient documentation

## 2023-09-19 DIAGNOSIS — N184 Chronic kidney disease, stage 4 (severe): Secondary | ICD-10-CM | POA: Diagnosis not present

## 2023-09-19 DIAGNOSIS — E1139 Type 2 diabetes mellitus with other diabetic ophthalmic complication: Secondary | ICD-10-CM | POA: Diagnosis not present

## 2023-09-19 DIAGNOSIS — E1122 Type 2 diabetes mellitus with diabetic chronic kidney disease: Secondary | ICD-10-CM | POA: Diagnosis not present

## 2023-09-19 DIAGNOSIS — H25011 Cortical age-related cataract, right eye: Secondary | ICD-10-CM | POA: Diagnosis present

## 2023-09-19 DIAGNOSIS — E1136 Type 2 diabetes mellitus with diabetic cataract: Secondary | ICD-10-CM | POA: Diagnosis not present

## 2023-09-19 DIAGNOSIS — H25041 Posterior subcapsular polar age-related cataract, right eye: Secondary | ICD-10-CM | POA: Diagnosis present

## 2023-09-19 DIAGNOSIS — H401111 Primary open-angle glaucoma, right eye, mild stage: Secondary | ICD-10-CM | POA: Diagnosis not present

## 2023-09-19 DIAGNOSIS — I129 Hypertensive chronic kidney disease with stage 1 through stage 4 chronic kidney disease, or unspecified chronic kidney disease: Secondary | ICD-10-CM | POA: Diagnosis not present

## 2023-09-19 DIAGNOSIS — Z6838 Body mass index (BMI) 38.0-38.9, adult: Secondary | ICD-10-CM | POA: Insufficient documentation

## 2023-09-19 DIAGNOSIS — Z87891 Personal history of nicotine dependence: Secondary | ICD-10-CM | POA: Diagnosis not present

## 2023-09-19 DIAGNOSIS — I5032 Chronic diastolic (congestive) heart failure: Secondary | ICD-10-CM | POA: Diagnosis not present

## 2023-09-19 DIAGNOSIS — Z794 Long term (current) use of insulin: Secondary | ICD-10-CM | POA: Diagnosis not present

## 2023-09-19 DIAGNOSIS — Z7984 Long term (current) use of oral hypoglycemic drugs: Secondary | ICD-10-CM | POA: Diagnosis not present

## 2023-09-19 DIAGNOSIS — I130 Hypertensive heart and chronic kidney disease with heart failure and stage 1 through stage 4 chronic kidney disease, or unspecified chronic kidney disease: Secondary | ICD-10-CM | POA: Diagnosis not present

## 2023-09-19 DIAGNOSIS — G4730 Sleep apnea, unspecified: Secondary | ICD-10-CM | POA: Insufficient documentation

## 2023-09-19 HISTORY — PX: CATARACT EXTRACTION W/PHACO: SHX586

## 2023-09-19 LAB — GLUCOSE, CAPILLARY: Glucose-Capillary: 193 mg/dL — ABNORMAL HIGH (ref 70–99)

## 2023-09-19 SURGERY — PHACOEMULSIFICATION, CATARACT, WITH IOL INSERTION
Anesthesia: Monitor Anesthesia Care | Site: Eye | Laterality: Right

## 2023-09-19 MED ORDER — FENTANYL CITRATE (PF) 100 MCG/2ML IJ SOLN
INTRAMUSCULAR | Status: AC
  Filled 2023-09-19: qty 2

## 2023-09-19 MED ORDER — SIGHTPATH DOSE#1 NA HYALUR & NA CHOND-NA HYALUR IO KIT
PACK | INTRAOCULAR | Status: DC | PRN
  Administered 2023-09-19: 1 via OPHTHALMIC

## 2023-09-19 MED ORDER — SIGHTPATH DOSE#1 BSS IO SOLN
INTRAOCULAR | Status: DC | PRN
  Administered 2023-09-19: 15 mL via INTRAOCULAR

## 2023-09-19 MED ORDER — TETRACAINE HCL 0.5 % OP SOLN
1.0000 [drp] | OPHTHALMIC | Status: DC | PRN
  Administered 2023-09-19 (×3): 1 [drp] via OPHTHALMIC

## 2023-09-19 MED ORDER — MIDAZOLAM HCL 2 MG/2ML IJ SOLN
INTRAMUSCULAR | Status: DC | PRN
  Administered 2023-09-19: 2 mg via INTRAVENOUS

## 2023-09-19 MED ORDER — ARMC OPHTHALMIC DILATING DROPS
1.0000 | OPHTHALMIC | Status: DC | PRN
  Administered 2023-09-19 (×3): 1 via OPHTHALMIC

## 2023-09-19 MED ORDER — TETRACAINE HCL 0.5 % OP SOLN
OPHTHALMIC | Status: AC
  Filled 2023-09-19: qty 4

## 2023-09-19 MED ORDER — ARMC OPHTHALMIC DILATING DROPS
OPHTHALMIC | Status: AC
  Filled 2023-09-19: qty 0.5

## 2023-09-19 MED ORDER — FENTANYL CITRATE (PF) 100 MCG/2ML IJ SOLN
INTRAMUSCULAR | Status: DC | PRN
  Administered 2023-09-19 (×2): 25 ug via INTRAVENOUS

## 2023-09-19 MED ORDER — PHENYLEPHRINE-KETOROLAC 1-0.3 % IO SOLN
INTRAOCULAR | Status: DC | PRN
  Administered 2023-09-19: 104 mL via OPHTHALMIC

## 2023-09-19 MED ORDER — LIDOCAINE HCL (PF) 2 % IJ SOLN
INTRAOCULAR | Status: DC | PRN
  Administered 2023-09-19: 4 mL via INTRAOCULAR

## 2023-09-19 MED ORDER — BRIMONIDINE TARTRATE-TIMOLOL 0.2-0.5 % OP SOLN
OPHTHALMIC | Status: DC | PRN
  Administered 2023-09-19: 1 [drp] via OPHTHALMIC

## 2023-09-19 MED ORDER — MOXIFLOXACIN HCL 0.5 % OP SOLN
OPHTHALMIC | Status: DC | PRN
  Administered 2023-09-19: .2 mL via OPHTHALMIC

## 2023-09-19 MED ORDER — MIDAZOLAM HCL 2 MG/2ML IJ SOLN
INTRAMUSCULAR | Status: AC
  Filled 2023-09-19: qty 2

## 2023-09-19 SURGICAL SUPPLY — 12 items
CATARACT SUITE SIGHTPATH (MISCELLANEOUS) ×2 IMPLANT
DISSECTOR HYDRO NUCLEUS 50X22 (MISCELLANEOUS) ×2 IMPLANT
DRSG TEGADERM 2-3/8X2-3/4 SM (GAUZE/BANDAGES/DRESSINGS) ×2 IMPLANT
FEE CATARACT SUITE SIGHTPATH (MISCELLANEOUS) ×1 IMPLANT
GLOVE BIOGEL PI IND STRL 8 (GLOVE) ×2 IMPLANT
GLOVE SURG LX STRL 7.5 STRW (GLOVE) ×2 IMPLANT
GLOVE SURG PROTEXIS BL SZ6.5 (GLOVE) ×2 IMPLANT
GLOVE SURG SYN 6.5 PF PI BL (GLOVE) ×1 IMPLANT
LENS IOL TECNIS MONO 19.0 (Intraocular Lens) ×1 IMPLANT
NDL FILTER BLUNT 18X1 1/2 (NEEDLE) ×1 IMPLANT
NEEDLE FILTER BLUNT 18X1 1/2 (NEEDLE) ×2 IMPLANT
SYR 3ML LL SCALE MARK (SYRINGE) ×2 IMPLANT

## 2023-09-19 NOTE — H&P (Signed)
 [facility]   Primary Care Physician:  Ma Saupe, MD Ophthalmologist: Dr. Meg Spina  Pre-Procedure History & Physical: HPI:  David Klein is a 53 y.o. male here for cataract surgery.   Past Medical History:  Diagnosis Date   Allergy    Grade II diastolic dysfunction    Grade II diastolic dysfunction    Hyperlipidemia    Hypertension    Mild mitral regurgitation by prior echocardiogram    Morbid obesity with BMI of 40.0-44.9, adult (HCC)    Sleep apnea    CPAP   Type 2 diabetes mellitus with hyperosmolar hyperglycemic state (HHS) (HCC)    Type 2 diabetes mellitus with stage 4 chronic kidney disease, with long-term current use of insulin  [facility])     Past Surgical History:  Procedure Laterality Date   COLONOSCOPY WITH PROPOFOL  N/A 03/07/2022   Procedure: COLONOSCOPY WITH PROPOFOL ;  Surgeon: Selena Daily, MD;  Location: [facility] ENDOSCOPY;  Service: Gastroenterology;  Laterality: N/A;   TOOTH EXTRACTION     WISDOM TOOTH EXTRACTION Bilateral     Prior to Admission medications   Medication Sig Start Date End Date Taking? Authorizing Provider  aspirin  81 MG EC tablet Take 1 tablet (81 mg total) by mouth daily. 02/05/21   Amin, Sumayya, MD  B-D UF III MINI PEN NEEDLES 31G X 5 MM MISC USE AS DIRECTED AT BEDTIME 09/04/22   Ma Saupe, MD  blood glucose meter kit and supplies KIT Dispense based on patient and insurance preference. Use up to four times daily as directed. Patient taking differently: Inject 1 each into the skin See admin instructions. Dispense based on patient and insurance preference. Use up to four times daily as directed. 02/05/21   Amin, Sumayya, MD  carvedilol  (COREG ) 25 MG tablet Take 1 tablet (25 mg total) by mouth 2 (two) times daily. Pt needs to make appt with provider for additional refills - 1st attempt 07/16/23   [name], Cadence H, PA-C  Continuous Blood Gluc Sensor (FREESTYLE LIBRE 2 SENSOR) MISC 1 Device by Does not apply route every 14  (fourteen) days. 05/09/22   Matthews, Jason J, MD  hydrALAZINE  (APRESOLINE ) 100 MG tablet TAKE 1 TABLET BY MOUTH THREE TIMES A DAY 06/27/23   [name], Cadence H, PA-C  insulin  glargine (LANTUS  SOLOSTAR) 100 UNIT/ML Solostar Pen Inject 40 Units into the skin every morning. And pen needles 1/day. 06/23/21   Gwyndolyn Lerner, MD  isosorbide  mononitrate (IMDUR ) 60 MG 24 hr tablet Take 1 tablet (60 mg total) by mouth 2 (two) times daily. 09/09/23   End, Veryl Gottron, MD  loratadine (CLARITIN) 10 MG tablet Take 10 mg by mouth daily.    [provider]  rosuvastatin  (CRESTOR ) 40 MG tablet TAKE 1 TABLET BY MOUTH EVERY DAY 06/05/22   Matthews, Jason J, MD  Semaglutide , 1 MG/DOSE, (OZEMPIC , 1 MG/DOSE,) 2 MG/1.5ML SOPN Inject 1 mg into the skin once a week.    [provider]  torsemide  (DEMADEX ) 20 MG tablet TAKE 1 TABLET BY MOUTH TWICE A DAY 06/07/23   [name], Cadence H, PA-C    Allergies as of 09/11/2023   (No Known Allergies)    Family History  Problem Relation Age of Onset   Hypertension Mother    Hypertension Father    Lung cancer Father    Diabetes Sister    Hypertension Sister    Diabetes Sister    Hypertension Sister    Hypertension Brother    Stroke Paternal Grandmother  Social History   Socioeconomic History   Marital status: Single    Spouse name: Not on file   Number of children: 0   Years of education: 57   Highest education level: Master's degree (e.g., MA, MS, MEng, MEd, MSW, MBA)  Occupational History   Not on file  Tobacco Use   Smoking status: Former    Types: Cigars   Smokeless tobacco: Never   Tobacco comments:    No cigars in over 1 year  Vaping Use   Vaping status: Never Used  Substance and Sexual Activity   Alcohol use: Yes    Comment: Few drinks every few months.   Drug use: Never   Sexual activity: Not Currently    Partners: Female  Other Topics Concern   Not on file  Social History Narrative   Not on file   Social Drivers of Health    Financial Resource Strain: Low Risk  (08/14/2023)   Overall Financial Resource Strain (CARDIA)    Difficulty of Paying Living Expenses: Not hard at all  Food Insecurity: No Food Insecurity (08/14/2023)   Hunger Vital Sign    Worried About Running Out of Food in the Last Year: Never true    Ran Out of Food in the Last Year: Never true  Transportation Needs: No Transportation Needs (08/14/2023)   PRAPARE - Administrator, Civil Service (Medical): No    Lack of Transportation (Non-Medical): No  Physical Activity: Not on file  Stress: Not on file  Social Connections: Not on file  Intimate Partner Violence: Not At Risk (02/05/2022)   Humiliation, Afraid, Rape, and Kick questionnaire    Fear of Current or Ex-Partner: No    Emotionally Abused: No    Physically Abused: No    Sexually Abused: No    Review of Systems: See HPI, otherwise negative ROS  Physical Exam: Ht 5\' 4"  (1.626 m)   Wt 102.5 kg   BMI 38.79 kg/m  General:   Alert, cooperative in NAD Head:  Normocephalic and atraumatic. Respiratory:  Normal work of breathing. Cardiovascular:  RRR  Impression/Plan: David Klein is here for cataract surgery.  Risks, benefits, limitations, and alternatives regarding cataract surgery have been reviewed with the patient.  Questions have been answered.  All parties agreeable.   Trudi Fus, MD  09/19/2023, 7:11 AM

## 2023-09-19 NOTE — Transfer of Care (Signed)
 Immediate Anesthesia Transfer of Care Note  Patient: Armin Landing  Procedure(s) Performed: PHACOEMULSIFICATION, CATARACT, WITH IOL INSERTION 1.57 00:19.8 (Right: Eye)  Patient Location: PACU  Anesthesia Type: MAC  Level of Consciousness: awake, alert  and patient cooperative  Airway and Oxygen Therapy: Patient Spontanous Breathing and Patient connected to supplemental oxygen  Post-op Assessment: Post-op Vital signs reviewed, Patient's Cardiovascular Status Stable, Respiratory Function Stable, Patent Airway and No signs of Nausea or vomiting  Post-op Vital Signs: Reviewed and stable  Complications: No notable events documented.

## 2023-09-19 NOTE — Anesthesia Postprocedure Evaluation (Signed)
 Anesthesia Post Note  Patient: David Klein  Procedure(s) Performed: PHACOEMULSIFICATION, CATARACT, WITH IOL INSERTION 1.57 00:19.8 (Right: Eye)  Patient location during evaluation: PACU Anesthesia Type: MAC Level of consciousness: awake and alert Pain management: pain level controlled Vital Signs Assessment: post-procedure vital signs reviewed and stable Respiratory status: spontaneous breathing, nonlabored ventilation, respiratory function stable and patient connected to nasal cannula oxygen Cardiovascular status: stable and blood pressure returned to baseline Postop Assessment: no apparent nausea or vomiting Anesthetic complications: no   No notable events documented.   Last Vitals:  Vitals:   09/19/23 1205 09/19/23 1210  BP: (!) 178/93 (!) 185/94  Pulse:  82  Resp: 20 14  Temp: 36.7 C 36.7 C  SpO2: 95% 97%    Last Pain:  Vitals:   09/19/23 1210  TempSrc:   PainSc: 0-No pain                 Kenneth Lax C [name]

## 2023-09-19 NOTE — Op Note (Signed)
 OPERATIVE NOTE  David Klein [id number] 09/19/2023   PREOPERATIVE DIAGNOSIS: Nuclear sclerotic cataract right eye. H25.11   POSTOPERATIVE DIAGNOSIS: Nuclear sclerotic cataract right eye. H25.11   PROCEDURE:  Phacoemusification with posterior chamber intraocular lens placement of the right eye  Ultrasound time: Procedure(s): PHACOEMULSIFICATION, CATARACT, WITH IOL INSERTION 1.57 00:19.8 (Right)  LENS:   Implant Name Type Inv. Item Serial No. Manufacturer Lot No. LRB No. Used Action  LENS IOL TECNIS MONO 19.0 - [id number] Intraocular Lens LENS IOL TECNIS MONO 19.0 [id number] [employer]  Right 1 Implanted      SURGEON:  Rosy Cooper. Donalda Fruit, MD   ANESTHESIA:  Topical with tetracaine  drops, augmented with 1% preservative-free intracameral lidocaine .   COMPLICATIONS:  None.   DESCRIPTION OF PROCEDURE:  The patient was identified in the holding room and transported to the operating room and placed in the supine position under the operating microscope.  The right eye was identified as the operative eye, which was prepped and draped in the usual sterile ophthalmic fashion.   A 1 millimeter clear-corneal paracentesis was made superotemporally. Preservative-free 1% lidocaine  mixed with 1:1,000 bisulfite-free aqueous solution of epinephrine was injected into the anterior chamber. The anterior chamber was then filled with Viscoat viscoelastic. A 2.4 millimeter keratome was used to make a clear-corneal incision inferotemporally. A curvilinear capsulorrhexis was made with a cystotome and capsulorrhexis forceps. Balanced salt solution was used to hydrodissect and hydrodelineate the nucleus. Phacoemulsification was then used to remove the lens nucleus and epinucleus. The remaining cortex was then removed using the irrigation and aspiration handpiece. Provisc was then placed into the capsular bag to distend it for lens placement. A +19.00 D DCB00 intraocular lens was then injected into the capsular bag. The  remaining viscoelastic was aspirated.   Wounds were hydrated with balanced salt solution.  The anterior chamber was inflated to a physiologic pressure with balanced salt solution.  No wound leaks were noted. Moxifloxacin was injected intracamerally.  Timolol and Brimonidine drops were applied to the eye.  The patient was taken to the recovery room in stable condition without complications of anesthesia or surgery.  [name] 09/19/2023, 12:05 PM

## 2023-09-20 ENCOUNTER — Encounter: Payer: Self-pay | Admitting: Ophthalmology

## 2023-09-20 ENCOUNTER — Ambulatory Visit: Admitting: Internal Medicine

## 2023-09-20 ENCOUNTER — Other Ambulatory Visit: Payer: Self-pay

## 2023-09-25 ENCOUNTER — Other Ambulatory Visit: Payer: Self-pay | Admitting: Medical

## 2023-09-25 DIAGNOSIS — I10 Essential (primary) hypertension: Secondary | ICD-10-CM

## 2023-09-25 NOTE — Anesthesia Preprocedure Evaluation (Addendum)
 Anesthesia Evaluation  Patient identified by MRN, date of birth, ID band Patient awake    Reviewed: Allergy & Precautions, H&P , NPO status , Patient's Chart, lab work & pertinent test results  Airway Mallampati: III  TM Distance: >3 FB Neck ROM: Full    Dental no notable dental hx.    Pulmonary neg pulmonary ROS, shortness of breath, sleep apnea , former smoker   Pulmonary exam normal breath sounds clear to auscultation       Cardiovascular hypertension, negative cardio ROS Normal cardiovascular exam Rhythm:Regular Rate:Normal  05-18-21 echo   Patient is morbidly obese.  IMPRESSIONS       1. Left ventricular ejection fraction, by estimation, is 55 to 60%. The  left ventricle has normal function. The left ventricle has no regional  wall motion abnormalities. There is mild to moderate left ventricular  hypertrophy. Left ventricular diastolic  parameters are consistent with Grade II diastolic dysfunction  (pseudonormalization).   2. Right ventricular systolic function is normal. The right ventricular  size is normal. Tricuspid regurgitation signal is inadequate for assessing  PA pressure.   3. Left atrial size was mildly dilated.   4. The mitral valve is normal in structure. Mild mitral valve  regurgitation. No evidence of mitral stenosis.   5. The aortic valve was not well visualized. Aortic valve regurgitation  is not visualized. No aortic stenosis is present.   6. The inferior vena cava is normal in size with greater than 50%  respiratory variability, suggesting right atrial pressure of 3 mmHg.         Neuro/Psych  PSYCHIATRIC DISORDERS Anxiety     negative neurological ROS  negative psych ROS   GI/Hepatic negative GI ROS, Neg liver ROS,,,  Endo/Other  negative endocrine ROSdiabetes    Renal/GU Renal diseasenegative Renal ROS  negative genitourinary   Musculoskeletal negative musculoskeletal ROS (+)     Abdominal   Peds negative pediatric ROS (+)  Hematology negative hematology ROS (+)   Anesthesia Other Findings Previous cataract surgery 09-19-23 Dr. Aldo Amble    Hypertension             Allergy Hyperlipidemia             Sleep apnea Type 2 diabetes mellitus with stage 4 chronic kidney disease, with long-term current use of insulin        Type 2 diabetes mellitus with hyperosmolar hyperglycemic state Grade II diastolic dysfunction            Mild mitral regurgitation by prior echocardiogram Morbid obesity with BMI of 40.0-44.9, adult  Grade II diastolic dysfunction   States he saw cardiologist  and cardiologist took BP manually and BP was in the region of 150/90.    Reproductive/Obstetrics negative OB ROS                             Anesthesia Physical Anesthesia Plan  ASA: 3  Anesthesia Plan: MAC   Post-op Pain Management:    Induction: Intravenous  PONV Risk Score and Plan:   Airway Management Planned: Natural Airway and Nasal Cannula  Additional Equipment:   Intra-op Plan:   Post-operative Plan:   Informed Consent: I have reviewed the patients History and Physical, chart, labs and discussed the procedure including the risks, benefits and alternatives for the proposed anesthesia with the patient or authorized representative who has indicated his/her understanding and acceptance.     Dental Advisory Given  Plan Discussed  with: Anesthesiologist, CRNA and Surgeon  Anesthesia Plan Comments: (Patient consented for risks of anesthesia including but not limited to:  - adverse reactions to medications - damage to eyes, teeth, lips or other oral mucosa - nerve damage due to positioning  - sore throat or hoarseness - Damage to heart, brain, nerves, lungs, other parts of body or loss of life  Patient voiced understanding and assent.)        Anesthesia Quick Evaluation

## 2023-10-01 NOTE — Discharge Instructions (Signed)

## 2023-10-03 ENCOUNTER — Ambulatory Visit: Payer: Self-pay | Admitting: Anesthesiology

## 2023-10-03 ENCOUNTER — Encounter: Payer: Self-pay | Admitting: Ophthalmology

## 2023-10-03 ENCOUNTER — Other Ambulatory Visit: Payer: Self-pay

## 2023-10-03 ENCOUNTER — Ambulatory Visit
Admission: RE | Admit: 2023-10-03 | Discharge: 2023-10-03 | Disposition: A | Discharge status: Home / Self Care | Attending: Ophthalmology | Admitting: Ophthalmology

## 2023-10-03 ENCOUNTER — Encounter
Admission: RE | Disposition: A | Discharge status: Home / Self Care | Payer: Self-pay | Source: Home / Self Care | Attending: Ophthalmology

## 2023-10-03 DIAGNOSIS — Z7985 Long-term (current) use of injectable non-insulin antidiabetic drugs: Secondary | ICD-10-CM | POA: Insufficient documentation

## 2023-10-03 DIAGNOSIS — H2512 Age-related nuclear cataract, left eye: Secondary | ICD-10-CM | POA: Diagnosis not present

## 2023-10-03 DIAGNOSIS — G4730 Sleep apnea, unspecified: Secondary | ICD-10-CM | POA: Insufficient documentation

## 2023-10-03 DIAGNOSIS — Z794 Long term (current) use of insulin: Secondary | ICD-10-CM | POA: Insufficient documentation

## 2023-10-03 DIAGNOSIS — Z6841 Body Mass Index (BMI) 40.0 and over, adult: Secondary | ICD-10-CM | POA: Insufficient documentation

## 2023-10-03 DIAGNOSIS — E1136 Type 2 diabetes mellitus with diabetic cataract: Secondary | ICD-10-CM | POA: Diagnosis not present

## 2023-10-03 DIAGNOSIS — I10 Essential (primary) hypertension: Secondary | ICD-10-CM | POA: Insufficient documentation

## 2023-10-03 DIAGNOSIS — H25012 Cortical age-related cataract, left eye: Secondary | ICD-10-CM | POA: Diagnosis present

## 2023-10-03 DIAGNOSIS — H25042 Posterior subcapsular polar age-related cataract, left eye: Secondary | ICD-10-CM | POA: Diagnosis present

## 2023-10-03 DIAGNOSIS — Z87891 Personal history of nicotine dependence: Secondary | ICD-10-CM | POA: Diagnosis not present

## 2023-10-03 DIAGNOSIS — I340 Nonrheumatic mitral (valve) insufficiency: Secondary | ICD-10-CM | POA: Diagnosis not present

## 2023-10-03 HISTORY — PX: CATARACT EXTRACTION W/PHACO: SHX586

## 2023-10-03 SURGERY — PHACOEMULSIFICATION, CATARACT, WITH IOL INSERTION
Anesthesia: Monitor Anesthesia Care | Site: Eye | Laterality: Left

## 2023-10-03 MED ORDER — FENTANYL CITRATE (PF) 100 MCG/2ML IJ SOLN
INTRAMUSCULAR | Status: AC
  Filled 2023-10-03: qty 2

## 2023-10-03 MED ORDER — PHENYLEPHRINE-KETOROLAC 1-0.3 % IO SOLN
INTRAOCULAR | Status: DC | PRN
  Administered 2023-10-03: 142 mL via OPHTHALMIC

## 2023-10-03 MED ORDER — LIDOCAINE HCL (PF) 2 % IJ SOLN
INTRAOCULAR | Status: DC | PRN
  Administered 2023-10-03: 1 mL via INTRAOCULAR

## 2023-10-03 MED ORDER — NA HYALUR & NA CHOND-NA HYALUR 0.55-0.5 ML IO KIT
PACK | INTRAOCULAR | Status: DC | PRN
  Administered 2023-10-03: 1 via OPHTHALMIC

## 2023-10-03 MED ORDER — MIDAZOLAM HCL 2 MG/2ML IJ SOLN
INTRAMUSCULAR | Status: AC
  Filled 2023-10-03: qty 2

## 2023-10-03 MED ORDER — ARMC OPHTHALMIC DILATING DROPS
OPHTHALMIC | Status: AC
  Filled 2023-10-03: qty 0.5

## 2023-10-03 MED ORDER — LACTATED RINGERS IV SOLN: INTRAVENOUS | Status: DC

## 2023-10-03 MED ORDER — ARMC OPHTHALMIC DILATING DROPS
1.0000 | OPHTHALMIC | Status: DC | PRN
Start: 2023-10-03 — End: 2023-10-03
  Administered 2023-10-03 (×3): 1 via OPHTHALMIC

## 2023-10-03 MED ORDER — FENTANYL CITRATE (PF) 100 MCG/2ML IJ SOLN
INTRAMUSCULAR | Status: DC | PRN
  Administered 2023-10-03: 100 ug via INTRAVENOUS

## 2023-10-03 MED ORDER — SIGHTPATH DOSE#1 NA HYALUR & NA CHOND-NA HYALUR IO KIT
PACK | INTRAOCULAR | Status: DC | PRN
  Administered 2023-10-03: 1 via OPHTHALMIC

## 2023-10-03 MED ORDER — TETRACAINE HCL 0.5 % OP SOLN
OPHTHALMIC | Status: AC
  Filled 2023-10-03: qty 4

## 2023-10-03 MED ORDER — BRIMONIDINE TARTRATE-TIMOLOL 0.2-0.5 % OP SOLN
OPHTHALMIC | Status: DC | PRN
  Administered 2023-10-03: 1 [drp] via OPHTHALMIC

## 2023-10-03 MED ORDER — MIDAZOLAM HCL 2 MG/2ML IJ SOLN
INTRAMUSCULAR | Status: DC | PRN
  Administered 2023-10-03: 2 mg via INTRAVENOUS

## 2023-10-03 MED ORDER — SIGHTPATH DOSE#1 BSS IO SOLN
INTRAOCULAR | Status: DC | PRN
  Administered 2023-10-03: 15 mL

## 2023-10-03 MED ORDER — MOXIFLOXACIN HCL 0.5 % OP SOLN
OPHTHALMIC | Status: DC | PRN
  Administered 2023-10-03: .2 mL via OPHTHALMIC

## 2023-10-03 MED ORDER — TETRACAINE HCL 0.5 % OP SOLN
1.0000 [drp] | OPHTHALMIC | Status: DC | PRN
Start: 2023-10-03 — End: 2023-10-03
  Administered 2023-10-03 (×3): 1 [drp] via OPHTHALMIC

## 2023-10-03 MED ORDER — TRYPAN BLUE 0.06 % IO SOSY
PREFILLED_SYRINGE | INTRAOCULAR | Status: DC | PRN
  Administered 2023-10-03: .2 mL via INTRAOCULAR

## 2023-10-03 SURGICAL SUPPLY — 13 items
CATARACT SUITE SIGHTPATH (MISCELLANEOUS) ×2 IMPLANT
CLIP IPRISM (KITS) ×1 IMPLANT
DISSECTOR HYDRO NUCLEUS 50X22 (MISCELLANEOUS) ×2 IMPLANT
DRSG TEGADERM 2-3/8X2-3/4 SM (GAUZE/BANDAGES/DRESSINGS) ×2 IMPLANT
FEE CATARACT SUITE SIGHTPATH (MISCELLANEOUS) ×1 IMPLANT
GLOVE BIOGEL PI IND STRL 8 (GLOVE) ×2 IMPLANT
GLOVE SURG LX STRL 7.5 STRW (GLOVE) ×2 IMPLANT
GLOVE SURG PROTEXIS BL SZ6.5 (GLOVE) ×2 IMPLANT
GLOVE SURG SYN 6.5 PF PI BL (GLOVE) ×1 IMPLANT
LENS IOL TECNIS MONO 19.0 (Intraocular Lens) ×1 IMPLANT
NDL FILTER BLUNT 18X1 1/2 (NEEDLE) ×1 IMPLANT
NEEDLE FILTER BLUNT 18X1 1/2 (NEEDLE) ×2 IMPLANT
SYR 3ML LL SCALE MARK (SYRINGE) ×2 IMPLANT

## 2023-10-03 NOTE — Op Note (Signed)
 OPERATIVE NOTE  David Klein [id number] 10/03/2023   PREOPERATIVE DIAGNOSIS: Nuclear sclerotic cataract left eye. H25.12   POSTOPERATIVE DIAGNOSIS: Nuclear sclerotic cataract left eye. H25.12   PROCEDURE:  Phacoemusification with posterior chamber intraocular lens placement of the left eye  Ultrasound time: Procedure(s): PHACOEMULSIFICATION, CATARACT, WITH IOL INSERTION  7.31  01:09.3 (Left)  LENS:   Implant Name Type Inv. Item Serial No. Manufacturer Lot No. LRB No. Used Action  LENS IOL TECNIS MONO 19.0 - [id number] Intraocular Lens LENS IOL TECNIS MONO 19.0 [id number] [employer]  Left 1 Implanted      SURGEON:  Rosy Cooper. Donalda Fruit, MD   ANESTHESIA:  Topical with tetracaine  drops, augmented with 1% preservative-free intracameral lidocaine .   COMPLICATIONS:  None.   DESCRIPTION OF PROCEDURE:  The patient was identified in the holding room and transported to the operating room and placed in the supine position under the operating microscope.  The left eye was identified as the operative eye, which was prepped and draped in the usual sterile ophthalmic fashion.   A 1 millimeter clear-corneal paracentesis was made inferotemporally. Preservative-free 1% lidocaine  mixed with 1:1,000 bisulfite-free aqueous solution of epinephrine  was injected into the anterior chamber. There was no red reflex secondary to dense posterior subcapsular and cortical opacification, so Trypan blue was injected intracamerally to stain the anterior capsule and facilitate safe creation of the capsulorrhexis. The anterior chamber was then filled with Viscoat viscoelastic. A 2.4 millimeter keratome was used to make a clear-corneal incision superotemporally. A curvilinear capsulorrhexis was made with a cystotome and capsulorrhexis forceps. Balanced salt  solution was used to hydrodissect and hydrodelineate the nucleus. Phacoemulsification was then used to remove the lens nucleus and epinucleus. The remaining cortex was then  removed using the irrigation and aspiration handpiece. Provisc was then placed into the capsular bag to distend it for lens placement. A +19.00 D DCB00 intraocular lens was then injected into the capsular bag. The remaining viscoelastic was aspirated.   Wounds were hydrated with balanced salt  solution.  The anterior chamber was inflated to a physiologic pressure with balanced salt  solution.  No wound leaks were noted. Moxifloxacin  was injected intracamerally.  Timolol  and Brimonidine  drops were applied to the eye.  The patient was taken to the recovery room in stable condition without complications of anesthesia or surgery.  [name] 10/03/2023, 12:29 PM

## 2023-10-03 NOTE — H&P (Signed)
 [facility]   Primary Care Physician:  Ma Saupe, MD Ophthalmologist: Dr. Meg Spina  Pre-Procedure History & Physical: HPI:  David Klein is a 53 y.o. male here for cataract surgery.   Past Medical History:  Diagnosis Date   Allergy    Grade II diastolic dysfunction    Grade II diastolic dysfunction    Hyperlipidemia    Hypertension    Mild mitral regurgitation by prior echocardiogram    Morbid obesity with BMI of 40.0-44.9, adult (HCC)    Sleep apnea    CPAP   Type 2 diabetes mellitus with hyperosmolar hyperglycemic state (HHS) (HCC)    Type 2 diabetes mellitus with stage 4 chronic kidney disease, with long-term current use of insulin  [facility])     Past Surgical History:  Procedure Laterality Date   CATARACT EXTRACTION W/PHACO Right 09/19/2023   Procedure: PHACOEMULSIFICATION, CATARACT, WITH IOL INSERTION 1.57 00:19.8;  Surgeon: Trudi Fus, MD;  Location: [facility];  Service: Ophthalmology;  Laterality: Right;   COLONOSCOPY WITH PROPOFOL  N/A 03/07/2022   Procedure: COLONOSCOPY WITH PROPOFOL ;  Surgeon: Selena Daily, MD;  Location: [facility] END[facility]COPY;  Service: Gastroenterology;  Laterality: N/A;   TOOTH EXTRACTION     WISDOM TOOTH EXTRACTION Bilateral     Prior to Admission medications   Medication Sig Start Date End Date Taking? Authorizing Provider  aspirin  81 MG EC tablet Take 1 tablet (81 mg total) by mouth daily. 02/05/21   Amin, Sumayya, MD  B-D UF III MINI PEN NEEDLES 31G X 5 MM MISC USE AS DIRECTED AT BEDTIME 09/04/22   Ma Saupe, MD  blood glucose meter kit and supplies KIT Dispense based on patient and insurance preference. Use up to four times daily as directed. Patient taking differently: Inject 1 each into the skin See admin instructions. Dispense based on patient and insurance preference. Use up to four times daily as directed. 02/05/21   Amin, Sumayya, MD  carvedilol  (COREG ) 25 MG tablet Take 1 tablet (25 mg total)  by mouth 2 (two) times daily. Pt needs to make appt with provider for additional refills - 1st attempt 07/16/23   [name], Cadence H, PA-C  Continuous Blood Gluc Sensor (FREESTYLE LIBRE 2 SENSOR) MISC 1 Device by Does not apply route every 14 (fourteen) days. 05/09/22   Matthews, Jason J, MD  dapagliflozin  propanediol (FARXIGA ) 5 MG TABS tablet Take 5 mg by mouth daily.    [provider]  hydrALAZINE  (APRESOLINE ) 100 MG tablet TAKE 1 TABLET BY MOUTH THREE TIMES A DAY 06/27/23   [name], Cadence H, PA-C  insulin  glargine (LANTUS  SOLOSTAR) 100 UNIT/ML Solostar Pen Inject 40 Units into the skin every morning. And pen needles 1/day. 06/23/21   Gwyndolyn Lerner, MD  isosorbide  mononitrate (IMDUR ) 60 MG 24 hr tablet Take 1 tablet (60 mg total) by mouth 2 (two) times daily. 09/09/23   End, Veryl Gottron, MD  loratadine (CLARITIN) 10 MG tablet Take 10 mg by mouth daily.    [provider]  rosuvastatin  (CRESTOR ) 40 MG tablet TAKE 1 TABLET BY MOUTH EVERY DAY 06/05/22   Matthews, Jason J, MD  Semaglutide , 1 MG/DOSE, (OZEMPIC , 1 MG/DOSE,) 2 MG/1.5ML SOPN Inject 1 mg into the skin once a week.    [provider]  torsemide  (DEMADEX ) 20 MG tablet TAKE 1 TABLET BY MOUTH TWICE A DAY 06/07/23   [name], Cadence H, PA-C    Allergies as of 09/11/2023   (No Known Allergies)    Family History  Problem Relation Age of Onset   Hypertension Mother    Hypertension Father    Lung cancer Father    Diabetes Sister    Hypertension Sister    Diabetes Sister    Hypertension Sister    Hypertension Brother    Stroke Paternal Grandmother     Social History   Socioeconomic History   Marital status: Single    Spouse name: Not on file   Number of children: 0   Years of education: 18   Highest education level: Master's degree (e.g., MA, MS, MEng, MEd, MSW, MBA)  Occupational History   Not on file  Tobacco Use   Smoking status: Former    Types: Cigars   Smokeless tobacco: Never   Tobacco comments:     No cigars in over 1 year  Vaping Use   Vaping status: Never Used  Substance and Sexual Activity   Alcohol use: Yes    Comment: Few drinks every few months.   Drug use: Never   Sexual activity: Not Currently    Partners: Female  Other Topics Concern   Not on file  Social History Narrative   Not on file   Social Drivers of Health   Financial Resource Strain: Low Risk  (08/14/2023)   Overall Financial Resource Strain (CARDIA)    Difficulty of Paying Living Expenses: Not hard at all  Food Insecurity: No Food Insecurity (08/14/2023)   Hunger Vital Sign    Worried About Running Out of Food in the Last Year: Never true    Ran Out of Food in the Last Year: Never true  Transportation Needs: No Transportation Needs (08/14/2023)   PRAPARE - Administrator, Civil Service (Medical): No    Lack of Transportation (Non-Medical): No  Physical Activity: Not on file  Stress: Not on file  Social Connections: Not on file  Intimate Partner Violence: Not At Risk (02/05/2022)   Humiliation, Afraid, Rape, and Kick questionnaire    Fear of Current or Ex-Partner: No    Emotionally Abused: No    Physically Abused: No    Sexually Abused: No    Review of Systems: See HPI, otherwise negative ROS  Physical Exam: There were no vitals taken for this visit. General:   Alert, cooperative in NAD Head:  Normocephalic and atraumatic. Respiratory:  Normal work of breathing. Cardiovascular:  RRR  Impression/Plan: David Klein is here for cataract surgery.  Risks, benefits, limitations, and alternatives regarding cataract surgery have been reviewed with the patient.  Questions have been answered.  All parties agreeable.   Trudi Fus, MD  10/03/2023, 7:10 AM

## 2023-10-03 NOTE — Transfer of Care (Signed)
 Immediate Anesthesia Transfer of Care Note  Patient: David Klein  Procedure(s) Performed: PHACOEMULSIFICATION, CATARACT, WITH IOL INSERTION  7.31  01:09.3 (Left: Eye)  Patient Location: PACU  Anesthesia Type: MAC  Level of Consciousness: awake, alert  and patient cooperative  Airway and Oxygen Therapy: Patient Spontanous Breathing and Patient connected to supplemental oxygen  Post-op Assessment: Post-op Vital signs reviewed, Patient's Cardiovascular Status Stable, Respiratory Function Stable, Patent Airway and No signs of Nausea or vomiting  Post-op Vital Signs: Reviewed and stable  Complications: No notable events documented.

## 2023-10-03 NOTE — Anesthesia Postprocedure Evaluation (Signed)
 Anesthesia Post Note  Patient: David Klein  Procedure(s) Performed: PHACOEMULSIFICATION, CATARACT, WITH IOL INSERTION  7.31  01:09.3 (Left: Eye)  Patient location during evaluation: PACU Anesthesia Type: MAC Level of consciousness: awake and alert Pain management: pain level controlled Vital Signs Assessment: post-procedure vital signs reviewed and stable Respiratory status: spontaneous breathing, nonlabored ventilation, respiratory function stable and patient connected to nasal cannula oxygen Cardiovascular status: stable and blood pressure returned to baseline Postop Assessment: no apparent nausea or vomiting Anesthetic complications: no   No notable events documented.   Last Vitals:  Vitals:   10/03/23 1026 10/03/23 1230  BP: (!) 176/90 (!) 143/86  Pulse:  70  Resp: 14 14  Temp: 37 C 36.8 C  SpO2: 97% 95%    Last Pain:  Vitals:   10/03/23 1230  TempSrc:   PainSc: 0-No pain                 Will Heinkel C Shiv Shuey

## 2023-10-04 ENCOUNTER — Encounter: Payer: Self-pay | Admitting: Ophthalmology

## 2023-10-06 LAB — GLUCOSE, CAPILLARY: Glucose-Capillary: 173 mg/dL — ABNORMAL HIGH (ref 70–99)

## 2023-10-08 ENCOUNTER — Other Ambulatory Visit: Payer: Self-pay | Admitting: Medical

## 2023-10-08 DIAGNOSIS — I10 Essential (primary) hypertension: Secondary | ICD-10-CM

## 2023-10-15 ENCOUNTER — Ambulatory Visit: Admitting: Medical

## 2023-10-28 NOTE — Progress Notes (Signed)
 [facility] Quality Team Note  Name: David Klein Date of Birth: Jan 20, 1971 MRN: [id number] Date: 10/28/2023  [facility] Quality Team has reviewed this patient's chart, please see recommendations below:  [facility] Quality Other; (CHART REVIEWED. CONTROLLING BLOOD PRESSURE AND GLYCEMIC STATUS ASSESSMENT OUT OF RANGE, NO KIDNEY HEALTH EVALUATION LABS FOUND.)

## 2023-10-29 ENCOUNTER — Ambulatory Visit: Attending: Medical | Admitting: Medical

## 2023-10-29 ENCOUNTER — Encounter: Payer: Self-pay | Admitting: Medical

## 2023-10-29 VITALS — BP 140/80 | HR 82 | Ht 64.0 in | Wt 221.6 lb

## 2023-10-29 DIAGNOSIS — N184 Chronic kidney disease, stage 4 (severe): Secondary | ICD-10-CM

## 2023-10-29 DIAGNOSIS — G4733 Obstructive sleep apnea (adult) (pediatric): Secondary | ICD-10-CM

## 2023-10-29 DIAGNOSIS — I10 Essential (primary) hypertension: Secondary | ICD-10-CM

## 2023-10-29 DIAGNOSIS — Z79899 Other long term (current) drug therapy: Secondary | ICD-10-CM

## 2023-10-29 DIAGNOSIS — I5032 Chronic diastolic (congestive) heart failure: Secondary | ICD-10-CM | POA: Diagnosis not present

## 2023-10-29 MED ORDER — AMLODIPINE BESYLATE 2.5 MG PO TABS: 2.5000 mg | ORAL_TABLET | Freq: Every day | ORAL | 3 refills | Status: AC

## 2023-10-29 NOTE — Progress Notes (Signed)
 Cardiology Office Note   Date:  10/29/2023  ID:  David, Klein [birth year], MRN [id number] PCP: Alvia Selinda PARAS, MD  [facility] Providers Cardiologist:  Lonni Hanson, MD    History of Present Illness David Klein is a 53 y.o. male with a hx of HFpEF, HTN, DM2, OSA, CKD stage 4 who presents for 1 month follow-up.    Hospitalization in 2022 with hypertensive urgency and hyperosmolar hyperglycemic state complicated by AKI on CKD. Referred to cardiology for BP control.    Patient was seen 04/12/21 and reported intermittent SOB and LLE on torsemide . BP was elevated and heart rate was high. Coreg  was added. Echo and renal US  were ordered. Echo showed normal LVEF, mild LVH and G2DD. US  renal arteries showed no stenosis. LLE improved on Torsemide .   Patient was last seen 09/06/2023 for blood pressure issues.  He had been scheduled for cataract surgery but this was canceled due to uncontrolled high pretension.  He was started on losartan  12.5 mg daily.  Labs were checked which showed worsening kidney function and high renin levels. Losartan  was stopped and Imdur  was incresed to 60mg  BID.   Today, the patient reports BP has been 140-150/80-90.he is wanting to do more exercise. He denies chest pain and shortness of breath. He is open to trying amlodipine . He says diet has been much better.   Studies Reviewed      Echo 04/2021  1. Left ventricular ejection fraction, by estimation, is 55 to 60%. The  left ventricle has normal function. The left ventricle has no regional  wall motion abnormalities. There is mild to moderate left ventricular  hypertrophy. Left ventricular diastolic  parameters are consistent with Grade II diastolic dysfunction  (pseudonormalization).   2. Right ventricular systolic function is normal. The right ventricular  size is normal. Tricuspid regurgitation signal is inadequate for assessing  PA pressure.   3. Left atrial size was mildly dilated.   4.  The mitral valve is normal in structure. Mild mitral valve  regurgitation. No evidence of mitral stenosis.   5. The aortic valve was not well visualized. Aortic valve regurgitation  is not visualized. No aortic stenosis is present.   6. The inferior vena cava is normal in size with greater than 50%  respiratory variability, suggesting right atrial pressure of 3 mmHg.    Renal US  04/2021 Right: Normal size right kidney. Normal right Resisitive Index.         Abnormal cortical thickness of right kidney. No evidence of         right renal artery stenosis. RRV flow present.  Left:  LRV flow present. No evidence of left renal artery stenosis.         Normal size of left kidney. Normal left Resistive Index.         Abnormal cortical thickness of the left kidney.  Mesenteric:  Normal Celiac artery and Superior Mesenteric artery findings.  Ectatic aorta.      Physical Exam VS:  BP (!) 140/80 (BP Location: Left Arm, Patient Position: Sitting, Cuff Size: Normal)   Pulse 82   Ht 5' 4 (1.626 m)   Wt 221 lb 9.6 oz (100.5 kg)   SpO2 98%   BMI 38.04 kg/m        Wt Readings from Last 3 Encounters:  10/29/23 221 lb 9.6 oz (100.5 kg)  10/03/23 219 lb (99.3 kg)  09/19/23 222 lb (100.7 kg)    GEN: Well nourished, well developed  in no acute distress NECK: No JVD; No carotid bruits CARDIAC: RRR, no murmurs, rubs, gallops RESPIRATORY:  Clear to auscultation without rales, wheezing or rhonchi  ABDOMEN: Soft, non-tender, non-distended EXTREMITIES:  No edema; No deformity   ASSESSMENT AND PLAN  HTN Kidney function worsened on Losartan , so this was stopped and Imdur  was increased. BP has been a little high with systolics 140-150s. Continue hydralazine  100mg  TID, Imdur  60mg  BID, and Coreg  25mg  BID. We will try amlodipine  2.5mg  daily. May need to consider cardura. BMET today.  CKD stage 4 Patient has not seen nephrology since 2023, I will place a referral. Recent eGFR 11, BUN 70, Scr 5.78. I will  re-check a BMET since stopping Losartan .  Chronic HFpEF The patient is euvolemic. Continue torsemide  20mg  daily and Farxiga  10mg  daily.   OSA He reports compliance with CPAP.        Dispo: Follow-up in 1 month  Signed, Jesselle Laflamme VEAR Fishman, PA-C

## 2023-10-29 NOTE — Patient Instructions (Signed)
 Medication Instructions: Your physician recommends the following medication changes.  START TAKING: Amlodipine  2.5 mg by mouth daily    *If you need a refill on your cardiac medications before your next appointment, please call your pharmacy*  Lab Work: Your provider would like for you to have following labs drawn today BMP.     Testing/Procedures: No test ordered today   Follow-Up: At [facility], you and your health needs are our priority.  As part of our continuing mission to provide you with exceptional heart care, our providers are all part of one team.  This team includes your primary Cardiologist (physician) and Advanced Practice Providers or APPs (Physician Assistants and Nurse Practitioners) who all work together to provide you with the care you need, when you need it.  Your next appointment:   1 month(s)  Provider:   Lonni Hanson, MD or Cadence Franchester, PA-C

## 2023-10-30 ENCOUNTER — Ambulatory Visit: Payer: Self-pay | Admitting: Medical

## 2023-10-30 LAB — BASIC METABOLIC PANEL WITH GFR
BUN/Creatinine Ratio: 10 (ref 9–20)
BUN: 52 mg/dL — ABNORMAL HIGH (ref 6–24)
CO2: 19 mmol/L — ABNORMAL LOW (ref 20–29)
Calcium: 9 mg/dL (ref 8.7–10.2)
Chloride: 97 mmol/L (ref 96–106)
Creatinine, Ser: 5.16 mg/dL — ABNORMAL HIGH (ref 0.76–1.27)
Glucose: 169 mg/dL — ABNORMAL HIGH (ref 70–99)
Potassium: 3.6 mmol/L (ref 3.5–5.2)
Sodium: 136 mmol/L (ref 134–144)
eGFR: 13 mL/min/{1.73_m2} — ABNORMAL LOW (ref >59)

## 2023-11-06 DIAGNOSIS — H401111 Primary open-angle glaucoma, right eye, mild stage: Secondary | ICD-10-CM | POA: Diagnosis not present

## 2023-11-06 DIAGNOSIS — H401121 Primary open-angle glaucoma, left eye, mild stage: Secondary | ICD-10-CM | POA: Diagnosis not present

## 2023-11-12 DIAGNOSIS — H401121 Primary open-angle glaucoma, left eye, mild stage: Secondary | ICD-10-CM | POA: Diagnosis not present

## 2023-11-12 DIAGNOSIS — H401111 Primary open-angle glaucoma, right eye, mild stage: Secondary | ICD-10-CM | POA: Diagnosis not present

## 2023-11-12 DIAGNOSIS — H35353 Cystoid macular degeneration, bilateral: Secondary | ICD-10-CM | POA: Diagnosis not present

## 2023-11-12 DIAGNOSIS — Z961 Presence of intraocular lens: Secondary | ICD-10-CM | POA: Diagnosis not present

## 2023-11-29 ENCOUNTER — Ambulatory Visit: Admitting: Medical

## 2023-12-10 ENCOUNTER — Encounter: Payer: Self-pay | Admitting: Medical

## 2023-12-10 ENCOUNTER — Ambulatory Visit: Attending: Medical | Admitting: Medical

## 2023-12-10 VITALS — BP 140/90 | HR 68 | Ht 64.0 in | Wt 222.0 lb

## 2023-12-10 DIAGNOSIS — G4733 Obstructive sleep apnea (adult) (pediatric): Secondary | ICD-10-CM

## 2023-12-10 DIAGNOSIS — N184 Chronic kidney disease, stage 4 (severe): Secondary | ICD-10-CM

## 2023-12-10 DIAGNOSIS — I5032 Chronic diastolic (congestive) heart failure: Secondary | ICD-10-CM | POA: Diagnosis not present

## 2023-12-10 DIAGNOSIS — I10 Essential (primary) hypertension: Secondary | ICD-10-CM

## 2023-12-10 MED ORDER — HYDRALAZINE HCL 100 MG PO TABS: 100.0000 mg | ORAL_TABLET | Freq: Three times a day (TID) | ORAL | 3 refills | Status: AC

## 2023-12-10 NOTE — Patient Instructions (Signed)
 Medication Instructions:  Your physician recommends that you continue on your current medications as directed. Please refer to the Current Medication list given to you today.    *If you need a refill on your cardiac medications before your next appointment, please call your pharmacy*  Lab Work: No labs ordered today    Testing/Procedures: No test ordered today   Follow-Up: At [facility], you and your health needs are our priority.  As part of our continuing mission to provide you with exceptional heart care, our providers are all part of one team.  This team includes your primary Cardiologist (physician) and Advanced Practice Providers or APPs (Physician Assistants and Nurse Practitioners) who all work together to provide you with the care you need, when you need it.  Your next appointment:   6 month(s)  Provider:   Lonni Hanson, MD or Cadence Franchester, PA-C

## 2023-12-10 NOTE — Progress Notes (Signed)
 Cardiology Office Note   Date:  12/10/2023  ID:  Thelbert, Gartin [birth year], MRN [id number] PCP: Alvia Selinda PARAS, MD  [facility] Providers Cardiologist:  Lonni Hanson, MD   History of Present Illness David Klein is a 53 y.o. male with a hx of HFpEF, HTN, DM2, OSA, CKD stage 4 who presents for follow-up of HTN.    Hospitalization in 2022 with hypertensive urgency and hyperosmolar hyperglycemic state complicated by AKI on CKD. Referred to cardiology for BP control.    Patient was seen 04/12/21 and reported intermittent SOB and LLE on torsemide . BP was elevated and heart rate was high. Coreg  was added. Echo and renal US  were ordered. Echo showed normal LVEF, mild LVH and G2DD. US  renal arteries showed no stenosis. LLE improved on Torsemide .    Patient was seen 09/06/2023 for blood pressure issues.  He had been scheduled for cataract surgery but this was canceled due to uncontrolled HTN.  He was started on losartan  12.5 mg daily.  Labs were checked which showed worsening kidney function and high renin levels. Losartan  was stopped and Imdur  was incresed to 60mg  BID.   Patient was last seen 10/29/2023 reporting blood pressure 140-150/80-90.  He was started on amlodipine  2.5 mg daily.  Today, the patient reports he has been feeling good. He started working out, high intensity work-out with body weight exercises.  He reports he is eating healthier.  Since he started amlodipine  he does have mild lower leg edema that is worse at night.  He said he ran out of hydralazine .  Studies Reviewed EKG Interpretation Date/Time:  Tuesday December 10 2023 15:16:42 EDT Ventricular Rate:  68 PR Interval:  144 QRS Duration:  102 QT Interval:  430 QTC Calculation: 457 R Axis:   86  Text Interpretation: Normal sinus rhythm T wave abnormality, consider lateral ischemia When compared with ECG of 06-Sep-2023 08:11, Nonspecific T wave abnormality, worse in Inferior leads Inverted T waves have  replaced nonspecific T wave abnormality in Lateral leads Confirmed by Franchester, Sotirios Navarro ([id number]) on 12/10/2023 3:28:11 PM    Echo 04/2021  1. Left ventricular ejection fraction, by estimation, is 55 to 60%. The  left ventricle has normal function. The left ventricle has no regional  wall motion abnormalities. There is mild to moderate left ventricular  hypertrophy. Left ventricular diastolic  parameters are consistent with Grade II diastolic dysfunction  (pseudonormalization).   2. Right ventricular systolic function is normal. The right ventricular  size is normal. Tricuspid regurgitation signal is inadequate for assessing  PA pressure.   3. Left atrial size was mildly dilated.   4. The mitral valve is normal in structure. Mild mitral valve  regurgitation. No evidence of mitral stenosis.   5. The aortic valve was not well visualized. Aortic valve regurgitation  is not visualized. No aortic stenosis is present.   6. The inferior vena cava is normal in size with greater than 50%  respiratory variability, suggesting right atrial pressure of 3 mmHg.    Renal US  04/2021 Right: Normal size right kidney. Normal right Resisitive Index.         Abnormal cortical thickness of right kidney. No evidence of         right renal artery stenosis. RRV flow present.  Left:  LRV flow present. No evidence of left renal artery stenosis.         Normal size of left kidney. Normal left Resistive Index.  Abnormal cortical thickness of the left kidney.  Mesenteric:  Normal Celiac artery and Superior Mesenteric artery findings.  Ectatic aorta.      Physical Exam VS:  BP (!) 140/90 (BP Location: Left Arm, Patient Position: Sitting, Cuff Size: Large)   Pulse 68   Ht 5' 4 (1.626 m)   Wt 222 lb (100.7 kg)   SpO2 99%   BMI 38.11 kg/m        Wt Readings from Last 3 Encounters:  12/10/23 222 lb (100.7 kg)  10/29/23 221 lb 9.6 oz (100.5 kg)  10/03/23 219 lb (99.3 kg)    GEN: Well nourished, well  developed in no acute distress NECK: No JVD; No carotid bruits CARDIAC: RRR, no murmurs, rubs, gallops RESPIRATORY:  Clear to auscultation without rales, wheezing or rhonchi  ABDOMEN: Soft, non-tender, non-distended EXTREMITIES: Mild lower leg edema; No deformity   ASSESSMENT AND PLAN  HTN BP today is reasonable. He reports he ran out of hydralazine .  Patient has been working on lifestyle changes with healthy eating and exercise.  I will send in a refill of hydralazine .  Continue amlodipine  2.5 mg daily, Coreg  25 mg twice daily, hydralazine  100 mg 3 times daily, Imdur  60 mg twice daily and torsemide  20 mg twice daily.  CKD stage 4 Most recent labs showed serum creatinine 5.16, BUN 52, GFR 13.  Chronic HFpEF Patient has mild lower leg edema since starting amlodipine .  Swelling is worse at the end of the day.  Continue torsemide  20 mg twice daily.  OSA He reports compliance with CPAP.        Dispo: Follow-up in 6 months  Signed, Leslye Puccini VEAR Fishman, PA-C

## 2023-12-13 DIAGNOSIS — Z9841 Cataract extraction status, right eye: Secondary | ICD-10-CM | POA: Diagnosis not present

## 2023-12-13 DIAGNOSIS — H40003 Preglaucoma, unspecified, bilateral: Secondary | ICD-10-CM | POA: Diagnosis not present

## 2023-12-13 DIAGNOSIS — H35353 Cystoid macular degeneration, bilateral: Secondary | ICD-10-CM | POA: Diagnosis not present

## 2023-12-20 DIAGNOSIS — H40003 Preglaucoma, unspecified, bilateral: Secondary | ICD-10-CM | POA: Diagnosis not present

## 2023-12-20 DIAGNOSIS — Z9841 Cataract extraction status, right eye: Secondary | ICD-10-CM | POA: Diagnosis not present

## 2023-12-20 DIAGNOSIS — H35353 Cystoid macular degeneration, bilateral: Secondary | ICD-10-CM | POA: Diagnosis not present

## 2023-12-24 DIAGNOSIS — H35352 Cystoid macular degeneration, left eye: Secondary | ICD-10-CM | POA: Diagnosis not present

## 2024-01-27 ENCOUNTER — Telehealth: Payer: Self-pay

## 2024-01-27 NOTE — Telephone Encounter (Signed)
-----   Message from Amy P sent at 01/27/2024  8:26 AM EDT ----- Regarding: FW: Appt Needed  ----- Message ----- From: Athena Kristene Favors, CMA Sent: 01/24/2024   4:18 PM EDT To: Greig Alderton Subject: Appt Needed                                    Appt for 2025 needed to close the following gaps.   Controlling High Blood Pressure OPEN   HBA1C < 8% OPEN   Kidney Health Evaluation for Patients with Diabetes OPEN

## 2024-01-27 NOTE — Telephone Encounter (Signed)
 Please call pt to schedule an appt.  KP

## 2024-01-30 DIAGNOSIS — H35351 Cystoid macular degeneration, right eye: Secondary | ICD-10-CM | POA: Diagnosis not present

## 2024-01-30 DIAGNOSIS — G4733 Obstructive sleep apnea (adult) (pediatric): Secondary | ICD-10-CM | POA: Diagnosis not present

## 2024-01-30 DIAGNOSIS — H35352 Cystoid macular degeneration, left eye: Secondary | ICD-10-CM | POA: Diagnosis not present

## 2024-03-06 ENCOUNTER — Telehealth: Payer: Self-pay

## 2024-03-06 NOTE — Telephone Encounter (Signed)
 Called patient and left VM. Patient needs a office visit to follow up on his diabetes and he needs labs by end of the year.  JM

## 2024-03-10 DIAGNOSIS — H35351 Cystoid macular degeneration, right eye: Secondary | ICD-10-CM | POA: Diagnosis not present

## 2024-03-25 ENCOUNTER — Telehealth: Payer: Self-pay | Admitting: Medical

## 2024-03-25 DIAGNOSIS — I10 Essential (primary) hypertension: Secondary | ICD-10-CM

## 2024-03-25 MED ORDER — TORSEMIDE 20 MG PO TABS
20.0000 mg | ORAL_TABLET | Freq: Two times a day (BID) | ORAL | 3 refills | Status: AC
Start: 2024-03-25 — End: ?

## 2024-03-25 NOTE — Telephone Encounter (Signed)
*  STAT* If patient is at the pharmacy, call can be transferred to refill team.   1. Which medications need to be refilled? (please list name of each medication and dose if known) torsemide  (DEMADEX ) 20 MG tablet    2. Would you like to learn more about the convenience, safety, & potential cost savings by using the [facility]? No    3. Are you open to using the [facility] (Type [facility]. No    4. Which pharmacy/location (including street and city if local pharmacy) is medication to be sent to? [id number] - [place], [place] - [address]     5. Do they need a 30 day or 90 day supply? 90 day   Pt is out of medication.

## 2024-03-25 NOTE — Telephone Encounter (Signed)
 Rx sent to pharmacy

## 2024-04-08 ENCOUNTER — Other Ambulatory Visit: Payer: Self-pay | Admitting: Medical

## 2024-04-08 DIAGNOSIS — I10 Essential (primary) hypertension: Secondary | ICD-10-CM

## 2024-04-11 ENCOUNTER — Telehealth: Payer: Self-pay

## 2024-04-11 NOTE — Telephone Encounter (Signed)
 Patient was identified as falling into the [place] Measure - Diabetes.   Patient was: Left voicemail to schedule with primary care provider.

## 2024-04-17 ENCOUNTER — Telehealth: Payer: Self-pay

## 2024-04-17 NOTE — Telephone Encounter (Signed)
 Patient was identified as falling into the [place] Measure - Diabetes.   Patient was: Left voicemail to schedule with primary care provider.

## 2024-04-28 ENCOUNTER — Ambulatory Visit: Admitting: Family Medicine

## 2024-05-01 ENCOUNTER — Ambulatory Visit: Admitting: Family Medicine

## 2024-05-04 ENCOUNTER — Ambulatory Visit: Admitting: Family Medicine

## 2024-05-08 ENCOUNTER — Ambulatory Visit: Admitting: Family Medicine

## 2024-05-11 ENCOUNTER — Ambulatory Visit: Admitting: Family Medicine

## 2024-05-14 ENCOUNTER — Ambulatory Visit: Admitting: Family Medicine

## 2024-05-18 ENCOUNTER — Ambulatory Visit: Admitting: Family Medicine

## 2024-05-22 ENCOUNTER — Encounter: Payer: Self-pay | Admitting: Family Medicine

## 2024-05-22 ENCOUNTER — Ambulatory Visit: Admitting: Family Medicine

## 2024-05-22 VITALS — BP 138/66 | HR 75 | Ht 64.0 in | Wt 229.0 lb

## 2024-05-22 DIAGNOSIS — N400 Enlarged prostate without lower urinary tract symptoms: Secondary | ICD-10-CM | POA: Diagnosis not present

## 2024-05-22 DIAGNOSIS — Z794 Long term (current) use of insulin: Secondary | ICD-10-CM | POA: Insufficient documentation

## 2024-05-22 DIAGNOSIS — Z7985 Long-term (current) use of injectable non-insulin antidiabetic drugs: Secondary | ICD-10-CM | POA: Diagnosis not present

## 2024-05-22 DIAGNOSIS — I10 Essential (primary) hypertension: Secondary | ICD-10-CM

## 2024-05-22 DIAGNOSIS — E1122 Type 2 diabetes mellitus with diabetic chronic kidney disease: Secondary | ICD-10-CM | POA: Insufficient documentation

## 2024-05-22 DIAGNOSIS — E785 Hyperlipidemia, unspecified: Secondary | ICD-10-CM | POA: Insufficient documentation

## 2024-05-22 DIAGNOSIS — N184 Chronic kidney disease, stage 4 (severe): Secondary | ICD-10-CM

## 2024-05-22 NOTE — Assessment & Plan Note (Signed)
°  Orders:   Comprehensive metabolic panel with GFR   Lipid panel

## 2024-05-22 NOTE — Assessment & Plan Note (Signed)
" °  Orders:   Comprehensive metabolic panel with GFR   Hemoglobin A1c   Urine Microalbumin w/creat. ratio  "

## 2024-05-22 NOTE — Assessment & Plan Note (Signed)
" °  Orders:   CBC   Comprehensive metabolic panel with GFR   Hemoglobin A1c   Urine Microalbumin w/creat. ratio  "

## 2024-05-22 NOTE — Assessment & Plan Note (Signed)
 Orders:    Lipid panel

## 2024-05-22 NOTE — Patient Instructions (Signed)
" °  VISIT SUMMARY: During your visit, we reviewed your blood pressure, diabetes, kidney function, and cholesterol levels. We discussed your current medications, dietary habits, and the importance of follow-up appointments with your specialists.  YOUR PLAN: BLOOD PRESSURE MANAGEMENT: Your blood pressure is currently well-controlled with your medications. -Continue taking your current medications: amlodipine  2.5 mg daily, carvedilol  25 mg twice daily, hydralazine  three times daily, isosorbide  mononitrate twice daily, and torsemide  twice daily. -Be mindful of your sodium intake, especially when dining out. Use salt substitutes when possible. -We have ordered comprehensive blood work to monitor for any secondary effects of your medications. -Follow up in three months.  TYPE 2 DIABETES AND CHRONIC KIDNEY DISEASE: Your diabetes is managed with Lantus , Ozempic , and Farxiga . Your kidney disease is stable, but you need to follow up with nephrology. -We have ordered comprehensive blood work to assess your blood sugar control and kidney function. -Your Farxiga  prescription has been refilled. -Discuss with your endocrinologist about possibly increasing your Ozempic  dose. -We have initiated a referral to nephrology. Please contact your previous nephrologist to schedule an appointment as soon as possible. -If you cannot get a timely appointment with nephrology, let us  know. -Continue to restrict dietary sodium and use compression stockings to manage your leg swelling. -Follow up in three months to review your lab results and coordinate your care.  HYPERLIPIDEMIA: Your cholesterol levels need to be monitored to manage your cardiovascular risk. -We have ordered comprehensive blood work, including a lipid panel. -It is important to manage your cholesterol levels to reduce your risk of heart and kidney problems. -Follow up in three months to review your lab results and adjust your therapy if  needed.    Contains text generated by [employer].   "

## 2024-05-22 NOTE — Progress Notes (Signed)
 "    Primary Care / Sports Medicine Office Visit  Patient Information:  Patient ID: David Klein, male DOB: [birth year] Age: 54 y.o. MRN: [id number]   David Klein is a pleasant 54 y.o. male presenting with the following:  Chief Complaint  Patient presents with   Hypertension    Patient following up on b/p. He has been checking it at home and it has been running 140/75.     Vitals:   05/22/24 0914  BP: 138/66  Pulse: 75  SpO2: 95%   Vitals:   05/22/24 0914  Weight: 229 lb (103.9 kg)  Height: 5' 4 (1.626 m)   Body mass index is 39.31 kg/m.  No results found.   Independent interpretation of notes and tests performed by another provider:   None  Procedures performed:   None  Pertinent History, Exam, Impression, and Recommendations:   Discussed the use of AI scribe software for clinical note transcription with the patient, who gave verbal consent to proceed.  History of Present Illness   David Klein is a 54 year old male with essential hypertension, type 2 diabetes, stage 4 chronic kidney disease, and hyperlipidemia who presents for routine follow-up of blood pressure and chronic disease management.  Blood Pressure Management: - Blood pressure previously elevated at 140/90 mmHg - Current antihypertensive regimen includes amlodipine  2.5 mg daily, carvedilol  25 mg twice daily, hydralazine  three times daily, isosorbide  mononitrate twice daily, and torsemide  twice daily - Adheres to medication regimen - Mindful of sodium intake at home, but finds sodium restriction more challenging when dining out - No chest pain, palpitations, or syncope  Glycemic Control: - Managed with Lantus  40 units daily and Ozempic  1 mg - Awaiting decision regarding Farxiga  refill - No recent laboratory testing since last cardiology visit or October endocrinology appointment - Last hemoglobin A1c was 7.1% in January 2024 - Weight loss from a peak of 246 lbs to 229-230 lbs, with  recent stabilization - Initial gastrointestinal side effects from Ozempic  have resolved  Renal Function: - Stage 4 chronic kidney disease - Not aware of recent kidney function testing - Scheduled for blood and urine testing today - No dysuria, urinary frequency, urgency, or hematuria  Peripheral Edema: - Bilateral lower extremity edema - Wears compression socks, working on more consistent use - Decreased compression in current socks - Currently taking a diuretic as part of his regimen  Care Coordination: - Managed by cardiology and endocrinology, previously nephrology - Last endocrinology visit in October 2025 - Follow-up appointments with endocrinology and cardiology scheduled for next month - Has not seen nephrology recently     Physical Exam MEASUREMENTS: Weight- 229. CHEST: Lungs clear to auscultation bilaterally. CARDIOVASCULAR: Heart sounds normal. EXTREMITIES: 1-2+ pitting edema in bilateral lower extremities.  Assessment and Plan    Essential hypertension Blood pressure controlled on current regimen. No acute issues. - Reviewed antihypertensive regimen: amlodipine  2.5 mg daily, carvedilol  25 mg twice daily, hydralazine  three times daily, Imdur  twice daily, torsemide  twice daily. - Reinforced dietary sodium restriction and use of salt substitutes. - Encouraged continued medication adherence. - Ordered comprehensive blood work to monitor for secondary effects. - Scheduled follow-up in three months.  Type 2 diabetes mellitus with stage 4 chronic kidney disease Type 2 diabetes managed with Lantus , Ozempic , and Farxiga ; A1c 7.1%. CKD stable; nephrology follow-up overdue. Mild bilateral lower extremity pitting edema, likely multifactorial. - Ordered comprehensive blood work to assess glycemic control and kidney function. - Refilled Farxiga  to prevent therapy  interruption. - Encouraged discussion with endocrinology regarding possible Ozempic  dose increase. - Initiated  referral to nephrology for ongoing CKD management; instructed him to contact previous nephrologist for earliest appointment. - Provided instructions to follow up with nephrology and notify if unable to secure timely appointment. - Reinforced dietary sodium restriction and use of compression stockings for edema management. - Scheduled follow-up in three months to review labs and coordinate care.  Hyperlipidemia Hyperlipidemia monitored for cardiovascular risk management. Updated labs needed. - Ordered comprehensive blood work including lipid panel. - Reinforced importance of lipid management for cardiovascular and renal risk reduction. - Scheduled follow-up in three months to review labs and adjust therapy.        Assessment & Plan Hypertension, unspecified type  Orders:   Comprehensive metabolic panel with GFR   Lipid panel  Type 2 diabetes mellitus with stage 4 chronic kidney disease, with long-term current use of insulin  (HCC)  Orders:   CBC   Comprehensive metabolic panel with GFR   Hemoglobin A1c   Urine Microalbumin w/creat. ratio  Hyperlipidemia, unspecified hyperlipidemia type  Orders:   Lipid panel  Long-term (current) use of injectable non-insulin  antidiabetic drugs  Orders:   Comprehensive metabolic panel with GFR   Hemoglobin A1c   Urine Microalbumin w/creat. ratio  Long-term insulin  use (HCC)  Orders:   Comprehensive metabolic panel with GFR   Hemoglobin A1c   Urine Microalbumin w/creat. ratio  Benign prostatic hyperplasia, unspecified whether lower urinary tract symptoms present  Orders:   CBC   PSA Total (Reflex To Free)   No follow-ups on file.     David JINNY Ku, MD, [facility]   Primary Care Sports Medicine Primary Care and Sports Medicine at [facility]   "

## 2024-05-23 LAB — CBC
Hematocrit: 28.5 % — ABNORMAL LOW (ref 37.5–51.0)
Hemoglobin: 9.1 g/dL — ABNORMAL LOW (ref 13.0–17.7)
MCH: 29.6 pg (ref 26.6–33.0)
MCHC: 31.9 g/dL (ref 31.5–35.7)
MCV: 93 fL (ref 79–97)
Platelets: 267 10*3/uL (ref 150–450)
RBC: 3.07 x10E6/uL — ABNORMAL LOW (ref 4.14–5.80)
RDW: 13.6 % (ref 11.6–15.4)
WBC: 6.1 10*3/uL (ref 3.4–10.8)

## 2024-05-23 LAB — LIPID PANEL
Chol/HDL Ratio: 3.5 ratio (ref 0.0–5.0)
Cholesterol, Total: 166 mg/dL (ref 100–199)
HDL: 48 mg/dL
LDL Chol Calc (NIH): 96 mg/dL (ref 0–99)
Triglycerides: 125 mg/dL (ref 0–149)
VLDL Cholesterol Cal: 22 mg/dL (ref 5–40)

## 2024-05-23 LAB — COMPREHENSIVE METABOLIC PANEL WITH GFR
ALT: 12 [IU]/L (ref 0–44)
AST: 19 [IU]/L (ref 0–40)
Albumin: 3.5 g/dL — ABNORMAL LOW (ref 3.8–4.9)
Alkaline Phosphatase: 107 [IU]/L (ref 47–123)
BUN/Creatinine Ratio: 10 (ref 9–20)
BUN: 67 mg/dL — ABNORMAL HIGH (ref 6–24)
Bilirubin Total: 0.3 mg/dL (ref 0.0–1.2)
CO2: 18 mmol/L — ABNORMAL LOW (ref 20–29)
Calcium: 8.8 mg/dL (ref 8.7–10.2)
Chloride: 104 mmol/L (ref 96–106)
Creatinine, Ser: 6.82 mg/dL — ABNORMAL HIGH (ref 0.76–1.27)
Globulin, Total: 3.5 g/dL (ref 1.5–4.5)
Glucose: 91 mg/dL (ref 70–99)
Potassium: 4.2 mmol/L (ref 3.5–5.2)
Sodium: 139 mmol/L (ref 134–144)
Total Protein: 7 g/dL (ref 6.0–8.5)
eGFR: 9 mL/min/{1.73_m2} — ABNORMAL LOW

## 2024-05-23 LAB — HEMOGLOBIN A1C
Est. average glucose Bld gHb Est-mCnc: 137 mg/dL
Hgb A1c MFr Bld: 6.4 % — ABNORMAL HIGH (ref 4.8–5.6)

## 2024-05-23 LAB — PSA TOTAL (REFLEX TO FREE): Prostate Specific Ag, Serum: 0.8 ng/mL (ref 0.0–4.0)

## 2024-05-23 LAB — MICROALBUMIN / CREATININE URINE RATIO
Creatinine, Urine: 68.9 mg/dL
Microalb/Creat Ratio: 4462 mg/g{creat} — ABNORMAL HIGH (ref 0–29)
Microalbumin, Urine: 3074.2 ug/mL

## 2024-05-27 ENCOUNTER — Ambulatory Visit: Payer: Self-pay | Admitting: Family Medicine

## 2024-05-27 DIAGNOSIS — D649 Anemia, unspecified: Secondary | ICD-10-CM

## 2024-05-29 LAB — ANEMIA PROFILE A
Iron Saturation: 20 % (ref 15–55)
Iron: 55 ug/dL (ref 38–169)
Total Iron Binding Capacity: 276 ug/dL (ref 250–450)
UIBC: 221 ug/dL (ref 111–343)

## 2024-05-29 LAB — SPECIMEN STATUS REPORT

## 2024-06-08 ENCOUNTER — Inpatient Hospital Stay

## 2024-06-08 ENCOUNTER — Inpatient Hospital Stay: Admitting: Oncology

## 2024-08-21 ENCOUNTER — Encounter: Admitting: Family Medicine
